# Patient Record
Sex: Female | Born: 1940 | Race: White | Hispanic: No | Marital: Married | Smoking: Never smoker
Health system: Southern US, Community
[De-identification: ages and names within clinical notes are randomized; demographics above are authoritative.]

## PROBLEM LIST (undated history)

## (undated) DIAGNOSIS — M1990 Unspecified osteoarthritis, unspecified site: Secondary | ICD-10-CM

## (undated) DIAGNOSIS — Z87898 Personal history of other specified conditions: Secondary | ICD-10-CM

## (undated) DIAGNOSIS — F419 Anxiety disorder, unspecified: Secondary | ICD-10-CM

## (undated) DIAGNOSIS — H9190 Unspecified hearing loss, unspecified ear: Secondary | ICD-10-CM

## (undated) DIAGNOSIS — K5730 Diverticulosis of large intestine without perforation or abscess without bleeding: Secondary | ICD-10-CM

## (undated) HISTORY — DX: Anxiety disorder, unspecified: F41.9

## (undated) HISTORY — DX: Personal history of other specified conditions: Z87.898

## (undated) HISTORY — PX: CARPAL TUNNEL RELEASE: SHX101

## (undated) HISTORY — PX: TONSILLECTOMY: SUR1361

## (undated) HISTORY — DX: Unspecified hearing loss, unspecified ear: H91.90

---

## 1997-08-20 ENCOUNTER — Other Ambulatory Visit: Admission: RE | Admit: 1997-08-20 | Discharge: 1997-08-20 | Payer: Self-pay | Admitting: Obstetrics and Gynecology

## 1997-09-11 ENCOUNTER — Ambulatory Visit (HOSPITAL_COMMUNITY): Admission: RE | Admit: 1997-09-11 | Discharge: 1997-09-11 | Payer: Self-pay | Admitting: Sports Medicine

## 1997-09-17 ENCOUNTER — Other Ambulatory Visit: Admission: RE | Admit: 1997-09-17 | Discharge: 1997-09-17 | Payer: Self-pay | Admitting: Obstetrics and Gynecology

## 1998-07-01 ENCOUNTER — Inpatient Hospital Stay (HOSPITAL_COMMUNITY): Admission: RE | Admit: 1998-07-01 | Discharge: 1998-07-06 | Payer: Self-pay

## 1998-11-18 ENCOUNTER — Other Ambulatory Visit: Admission: RE | Admit: 1998-11-18 | Discharge: 1998-11-18 | Payer: Self-pay | Admitting: Obstetrics and Gynecology

## 1999-06-25 ENCOUNTER — Encounter: Payer: Self-pay | Admitting: Obstetrics and Gynecology

## 1999-06-25 ENCOUNTER — Encounter: Admission: RE | Admit: 1999-06-25 | Discharge: 1999-06-25 | Payer: Self-pay | Admitting: Obstetrics and Gynecology

## 1999-07-07 ENCOUNTER — Encounter: Payer: Self-pay | Admitting: Obstetrics and Gynecology

## 1999-07-07 ENCOUNTER — Encounter: Admission: RE | Admit: 1999-07-07 | Discharge: 1999-07-07 | Payer: Self-pay | Admitting: Obstetrics and Gynecology

## 1999-07-08 ENCOUNTER — Other Ambulatory Visit: Admission: RE | Admit: 1999-07-08 | Discharge: 1999-07-08 | Payer: Self-pay | Admitting: Obstetrics and Gynecology

## 1999-12-01 ENCOUNTER — Other Ambulatory Visit: Admission: RE | Admit: 1999-12-01 | Discharge: 1999-12-01 | Payer: Self-pay | Admitting: Obstetrics and Gynecology

## 2000-07-07 ENCOUNTER — Encounter: Admission: RE | Admit: 2000-07-07 | Discharge: 2000-07-07 | Payer: Self-pay | Admitting: Obstetrics and Gynecology

## 2000-07-07 ENCOUNTER — Encounter: Payer: Self-pay | Admitting: Obstetrics and Gynecology

## 2000-08-09 ENCOUNTER — Other Ambulatory Visit: Admission: RE | Admit: 2000-08-09 | Discharge: 2000-08-09 | Payer: Self-pay | Admitting: Obstetrics and Gynecology

## 2001-07-26 ENCOUNTER — Encounter: Payer: Self-pay | Admitting: Obstetrics and Gynecology

## 2001-07-26 ENCOUNTER — Encounter: Admission: RE | Admit: 2001-07-26 | Discharge: 2001-07-26 | Payer: Self-pay | Admitting: Obstetrics and Gynecology

## 2001-08-02 ENCOUNTER — Encounter: Admission: RE | Admit: 2001-08-02 | Discharge: 2001-08-02 | Payer: Self-pay | Admitting: Obstetrics and Gynecology

## 2001-08-02 ENCOUNTER — Encounter: Payer: Self-pay | Admitting: Obstetrics and Gynecology

## 2001-08-21 ENCOUNTER — Other Ambulatory Visit: Admission: RE | Admit: 2001-08-21 | Discharge: 2001-08-21 | Payer: Self-pay | Admitting: Obstetrics and Gynecology

## 2002-06-07 HISTORY — PX: JOINT REPLACEMENT: SHX530

## 2002-10-29 ENCOUNTER — Encounter: Admission: RE | Admit: 2002-10-29 | Discharge: 2002-10-29 | Payer: Self-pay | Admitting: Obstetrics and Gynecology

## 2002-10-29 ENCOUNTER — Encounter: Payer: Self-pay | Admitting: Obstetrics and Gynecology

## 2002-10-30 ENCOUNTER — Other Ambulatory Visit: Admission: RE | Admit: 2002-10-30 | Discharge: 2002-10-30 | Payer: Self-pay | Admitting: Obstetrics and Gynecology

## 2003-12-26 ENCOUNTER — Encounter: Admission: RE | Admit: 2003-12-26 | Discharge: 2003-12-26 | Payer: Self-pay | Admitting: Obstetrics and Gynecology

## 2004-06-07 HISTORY — PX: COLON SURGERY: SHX602

## 2005-01-07 ENCOUNTER — Encounter: Admission: RE | Admit: 2005-01-07 | Discharge: 2005-01-07 | Payer: Self-pay | Admitting: Obstetrics and Gynecology

## 2006-02-01 ENCOUNTER — Encounter: Admission: RE | Admit: 2006-02-01 | Discharge: 2006-02-01 | Payer: Self-pay | Admitting: Obstetrics and Gynecology

## 2007-03-02 ENCOUNTER — Encounter: Admission: RE | Admit: 2007-03-02 | Discharge: 2007-03-02 | Payer: Self-pay | Admitting: Obstetrics and Gynecology

## 2008-03-05 ENCOUNTER — Encounter: Admission: RE | Admit: 2008-03-05 | Discharge: 2008-03-05 | Payer: Self-pay | Admitting: Obstetrics and Gynecology

## 2009-03-11 ENCOUNTER — Encounter: Admission: RE | Admit: 2009-03-11 | Discharge: 2009-03-11 | Payer: Self-pay | Admitting: Obstetrics and Gynecology

## 2010-04-07 ENCOUNTER — Encounter: Admission: RE | Admit: 2010-04-07 | Discharge: 2010-04-07 | Payer: Self-pay | Admitting: Obstetrics and Gynecology

## 2011-03-12 ENCOUNTER — Other Ambulatory Visit: Payer: Self-pay | Admitting: Obstetrics & Gynecology

## 2011-03-12 DIAGNOSIS — Z1231 Encounter for screening mammogram for malignant neoplasm of breast: Secondary | ICD-10-CM

## 2011-04-13 ENCOUNTER — Ambulatory Visit: Payer: Self-pay

## 2011-04-15 ENCOUNTER — Ambulatory Visit
Admission: RE | Admit: 2011-04-15 | Discharge: 2011-04-15 | Disposition: A | Discharge status: Home / Self Care | Payer: Medicare Other | Source: Ambulatory Visit | Attending: Obstetrics & Gynecology | Admitting: Obstetrics & Gynecology

## 2011-04-15 DIAGNOSIS — Z1231 Encounter for screening mammogram for malignant neoplasm of breast: Secondary | ICD-10-CM

## 2012-02-04 ENCOUNTER — Other Ambulatory Visit: Payer: Self-pay | Admitting: Orthopedic Surgery

## 2012-02-17 ENCOUNTER — Encounter (HOSPITAL_COMMUNITY): Payer: Self-pay

## 2012-02-17 NOTE — Pre-Procedure Instructions (Signed)
20 Christy Preston  02/17/2012   Your procedure is scheduled on:  Monday Septemeber 23, 2013.  Report to [facility] at 0530 AM.  Call this number if you have problems the morning of surgery: [phone]   Remember:   Do not eat food or drink:After Midnight.    Take these medicines the morning of surgery with A SIP OF WATER: NONE   Do not wear jewelry, make-up or nail polish.  Do not wear lotions, powders, or perfumes.   Do not shave 48 hours prior to surgery.   Do not bring valuables to the hospital.  Contacts, dentures or bridgework may not be worn into surgery.  Leave suitcase in the car. After surgery it may be brought to your room.  For patients admitted to the hospital, checkout time is 11:00 AM the day of discharge.   Patients discharged the day of surgery will not be allowed to drive home.  Name and phone number of your driver:   Special Instructions: CHG Shower Use Special Wash: 1/2 bottle night before surgery and 1/2 bottle morning of surgery.   Please read over the following fact sheets that you were given: Pain Booklet, Coughing and Deep Breathing, Blood Transfusion Information, Total Joint Packet, MRSA Information and Surgical Site Infection Prevention

## 2012-02-18 ENCOUNTER — Encounter (HOSPITAL_COMMUNITY): Payer: Self-pay

## 2012-02-18 ENCOUNTER — Ambulatory Visit (HOSPITAL_COMMUNITY)
Admission: RE | Admit: 2012-02-18 | Discharge: 2012-02-18 | Disposition: A | Discharge status: Home / Self Care | Payer: Medicare Other | Source: Ambulatory Visit | Attending: Orthopedic Surgery | Admitting: Orthopedic Surgery

## 2012-02-18 ENCOUNTER — Other Ambulatory Visit (HOSPITAL_COMMUNITY): Payer: Medicare Other

## 2012-02-18 ENCOUNTER — Encounter (HOSPITAL_COMMUNITY)
Admission: RE | Admit: 2012-02-18 | Discharge: 2012-02-18 | Disposition: A | Discharge status: Home / Self Care | Payer: Medicare Other | Source: Ambulatory Visit | Attending: Orthopedic Surgery | Admitting: Orthopedic Surgery

## 2012-02-18 DIAGNOSIS — Z01818 Encounter for other preprocedural examination: Secondary | ICD-10-CM | POA: Insufficient documentation

## 2012-02-18 DIAGNOSIS — Z01810 Encounter for preprocedural cardiovascular examination: Secondary | ICD-10-CM | POA: Insufficient documentation

## 2012-02-18 DIAGNOSIS — Z01812 Encounter for preprocedural laboratory examination: Secondary | ICD-10-CM | POA: Insufficient documentation

## 2012-02-18 HISTORY — DX: Unspecified osteoarthritis, unspecified site: M19.90

## 2012-02-18 HISTORY — DX: Diverticulosis of large intestine without perforation or abscess without bleeding: K57.30

## 2012-02-18 LAB — BASIC METABOLIC PANEL
CO2: 30 mEq/L (ref 19–32)
Calcium: 9.9 mg/dL (ref 8.4–10.5)
GFR calc non Af Amer: 88 mL/min — ABNORMAL LOW (ref >90)
Potassium: 3.8 mEq/L (ref 3.5–5.1)
Sodium: 139 mEq/L (ref 135–145)

## 2012-02-18 LAB — CBC WITH DIFFERENTIAL/PLATELET
Hemoglobin: 14.7 g/dL (ref 12.0–15.0)
Lymphocytes Relative: 16 % (ref 12–46)
Lymphs Abs: 1 10*3/uL (ref 0.7–4.0)
Monocytes Relative: 10 % (ref 3–12)
Neutrophils Relative %: 71 % (ref 43–77)
Platelets: 264 10*3/uL (ref 150–400)
RBC: 4.78 MIL/uL (ref 3.87–5.11)
WBC: 6.3 10*3/uL (ref 4.0–10.5)

## 2012-02-18 LAB — URINALYSIS, ROUTINE W REFLEX MICROSCOPIC
Bilirubin Urine: NEGATIVE
Ketones, ur: NEGATIVE mg/dL
Nitrite: NEGATIVE
Protein, ur: NEGATIVE mg/dL
Urobilinogen, UA: 0.2 mg/dL (ref 0.0–1.0)

## 2012-02-18 LAB — PROTIME-INR
INR: 0.92 (ref 0.00–1.49)
Prothrombin Time: 12.6 seconds (ref 11.6–15.2)

## 2012-02-18 LAB — SURGICAL PCR SCREEN
MRSA, PCR: POSITIVE — AB
Staphylococcus aureus: POSITIVE — AB

## 2012-02-18 LAB — TYPE AND SCREEN
ABO/RH(D): O POS
Antibody Screen: NEGATIVE

## 2012-02-18 LAB — APTT: aPTT: 32 seconds (ref 24–37)

## 2012-02-18 NOTE — Progress Notes (Signed)
Patient informed Nurse that she had a stress test a few years ago but was unsure as to where she had it at. PCP is Dr. Selena Batten at [facility]. Will request LOV and see if stress test results are at [facility].

## 2012-02-21 ENCOUNTER — Other Ambulatory Visit (HOSPITAL_COMMUNITY): Payer: Medicare Other

## 2012-02-22 NOTE — Consult Note (Addendum)
Anesthesia Chart Review:  Patient is a 71 year old female posted for a left TKA on 02/28/12 by Dr. Turner Daniels.  History includes non-smoker, arthritis, diverticulosis, colon resection '06, right TKA '04.  BMI is 25.27.  PCP is Dr. Gweneth Dimitri [facility]).  Labs noted.  CXR on 02/18/12 showed findings suggesting underlying chronic bronchitic change. No worrisome focal or acute abnormality identified.  EKG 02/18/12 showed NSR with sinus arrythmia, LAD, poor r wave progression in precordial leads, (new in V4-6) since 11/30/04.  She has had a stress test on 12/29/04 [facility]) that showed a fixed perfusion defect in the basal septum consistent with breast attenuation, no inducible ischemia, normal LV systolic function, no regional wall motion abnormalities, EF 91%.  No CV symptoms were documented at her PAT visit.  She has no documented history of HTN, DM, CAD/MI, CHF, or smoking.  Clinical correlation on the day of surgery.  I remains asymptomatic then anticipate she can proceed as planned. Anesthesiologist Dr. Chaney Malling agrees with plan.  Shonna Chock, PA-C

## 2012-02-25 NOTE — H&P (Signed)
  HPI: Patient presents with a chief complaint of left knee pain secondary to end-stage arthritis.  She has had this for many years.  She had a right total knee done down in [place] using a [employer] system in 2004 that is done well except for discomfort in the peripatellar region with some occasional numbness.  The total knee.  He has not had any swelling fever or erythema and continues to perform well.  Her left knee is down to bone-on-bone arthritis and she is considering knee replacement in September of 2013 here and [place].  She is wondering if a cortisone injection may help her left knee today.  All: None  ROS: 14 point review of systems form filled out by the patient was reviewed and was negative as it relates to the history of present illness except for:  She is not a diabetic.  She does not have elevated cholesterol she is worn glasses since 1956  PMH:  She had diverticulitis surgery in 2000, knee replacement 2004 on the right carpal tunnel release 2008.  FHx: Diabetes, high blood pressure heart disease and arthritis  SocHx:  She does not smoke.  She has 1 glass of wine every other day, she is married, lives with one other person and is retired.  PHYSICAL EXAM: Well-developed, well-nourished.  Awake, alert, and oriented x3.  Extraocular motion is intact.  No use of accessory respiratory muscles for breathing.   Cardiovascular exam reveals a regular rhythm.  Skin is intact without cuts, scrapes, or abrasions. The right total knee range is from 0-130, collateral ligaments are stable, there is no effusion.  Her wound is well-healed and there is no sign of infection.  The left knee has an obvious varus deformity lacks 5 of full extension, flexed about 115, 1+ effusion and again neurovascular intact.  Imaging/Tests: AP, Rosenberg, lateral, and sunrise x-rays of both knees ordered and interpreted by me show end-stage arthritis.  The left knee with varus deformity and a well-placed well  fixed cemented Katrinka Blazing nephew total knee on the right.  There is no sign of loosening of the components.  Assess: #1 Well-placed well fixed and well functioning right Smith Nephew total knee with a little bit of tenderness along the anterior aspect of the wound in the peripatellar region the subjective numbness.  #2 End-stage arthritis left knee  Plan:  The right knee will need annual checkups to make sure that the components are in good position with no evidence of loosening or ostial lysis.  She may call back in August to schedule her left knee replacement in September.  If she so desires.  It will be a Mudlogger cemented.

## 2012-02-27 MED ORDER — CEFAZOLIN SODIUM-DEXTROSE 2-3 GM-% IV SOLR
2.0000 g | INTRAVENOUS | Status: AC
  Administered 2012-02-28: 2 g via INTRAVENOUS
  Filled 2012-02-27: qty 50

## 2012-02-28 ENCOUNTER — Encounter (HOSPITAL_COMMUNITY): Payer: Self-pay | Admitting: *Deleted

## 2012-02-28 ENCOUNTER — Encounter (HOSPITAL_COMMUNITY)
Admission: RE | Disposition: A | Discharge status: Home Health | Payer: Self-pay | Source: Ambulatory Visit | Attending: Orthopedic Surgery

## 2012-02-28 ENCOUNTER — Encounter (HOSPITAL_COMMUNITY): Payer: Self-pay | Admitting: Vascular Surgery

## 2012-02-28 ENCOUNTER — Inpatient Hospital Stay (HOSPITAL_COMMUNITY): Payer: Medicare Other | Admitting: Vascular Surgery

## 2012-02-28 ENCOUNTER — Inpatient Hospital Stay (HOSPITAL_COMMUNITY)
Admission: RE | Admit: 2012-02-28 | Discharge: 2012-03-01 | DRG: 470 | Disposition: A | Discharge status: Home Health | Payer: Medicare Other | Source: Ambulatory Visit | Attending: Orthopedic Surgery | Admitting: Orthopedic Surgery

## 2012-02-28 DIAGNOSIS — Z7982 Long term (current) use of aspirin: Secondary | ICD-10-CM

## 2012-02-28 DIAGNOSIS — Z8261 Family history of arthritis: Secondary | ICD-10-CM

## 2012-02-28 DIAGNOSIS — Z8249 Family history of ischemic heart disease and other diseases of the circulatory system: Secondary | ICD-10-CM

## 2012-02-28 DIAGNOSIS — M1712 Unilateral primary osteoarthritis, left knee: Secondary | ICD-10-CM | POA: Diagnosis present

## 2012-02-28 DIAGNOSIS — Z79899 Other long term (current) drug therapy: Secondary | ICD-10-CM

## 2012-02-28 DIAGNOSIS — K5730 Diverticulosis of large intestine without perforation or abscess without bleeding: Secondary | ICD-10-CM | POA: Diagnosis present

## 2012-02-28 DIAGNOSIS — M1710 Unilateral primary osteoarthritis, unspecified knee: Principal | ICD-10-CM | POA: Diagnosis present

## 2012-02-28 DIAGNOSIS — Z96659 Presence of unspecified artificial knee joint: Secondary | ICD-10-CM

## 2012-02-28 DIAGNOSIS — Z833 Family history of diabetes mellitus: Secondary | ICD-10-CM

## 2012-02-28 HISTORY — PX: TOTAL KNEE ARTHROPLASTY: SHX125

## 2012-02-28 SURGERY — ARTHROPLASTY, KNEE, TOTAL
Anesthesia: General | Site: Knee | Laterality: Left | Wound class: Clean

## 2012-02-28 MED ORDER — DEXTROSE-NACL 5-0.45 % IV SOLN: INTRAVENOUS | Status: DC

## 2012-02-28 MED ORDER — HYDROMORPHONE HCL PF 1 MG/ML IJ SOLN
0.2500 mg | INTRAMUSCULAR | Status: DC | PRN
  Administered 2012-02-28 (×2): 0.5 mg via INTRAVENOUS

## 2012-02-28 MED ORDER — CEFUROXIME SODIUM 1.5 G IJ SOLR
INTRAMUSCULAR | Status: DC | PRN
  Administered 2012-02-28: 1.5 g

## 2012-02-28 MED ORDER — ACETAMINOPHEN 10 MG/ML IV SOLN
1000.0000 mg | Freq: Four times a day (QID) | INTRAVENOUS | Status: AC
  Administered 2012-02-28 – 2012-02-29 (×4): 1000 mg via INTRAVENOUS
  Filled 2012-02-28 (×4): qty 100

## 2012-02-28 MED ORDER — CHLORHEXIDINE GLUCONATE 4 % EX LIQD: 60.0000 mL | Freq: Once | CUTANEOUS | Status: DC

## 2012-02-28 MED ORDER — METOCLOPRAMIDE HCL 5 MG/ML IJ SOLN: 5.0000 mg | Freq: Three times a day (TID) | INTRAMUSCULAR | Status: DC | PRN

## 2012-02-28 MED ORDER — MAGNESIUM HYDROXIDE 400 MG/5ML PO SUSP: 30.0000 mL | Freq: Every day | ORAL | Status: DC | PRN

## 2012-02-28 MED ORDER — LACTATED RINGERS IV SOLN
INTRAVENOUS | Status: DC | PRN
  Administered 2012-02-28 (×2): via INTRAVENOUS

## 2012-02-28 MED ORDER — MIDAZOLAM HCL 5 MG/5ML IJ SOLN
INTRAMUSCULAR | Status: DC | PRN
  Administered 2012-02-28 (×2): 1 mg via INTRAVENOUS

## 2012-02-28 MED ORDER — ACETAMINOPHEN 325 MG PO TABS: 650.0000 mg | ORAL_TABLET | Freq: Four times a day (QID) | ORAL | Status: DC | PRN

## 2012-02-28 MED ORDER — OXYCODONE HCL 5 MG PO TABS
5.0000 mg | ORAL_TABLET | ORAL | Status: DC | PRN
  Administered 2012-02-28 – 2012-02-29 (×2): 5 mg via ORAL
  Administered 2012-02-29: 10 mg via ORAL
  Administered 2012-02-29 (×2): 5 mg via ORAL
  Administered 2012-03-01 (×2): 10 mg via ORAL
  Filled 2012-02-28 (×2): qty 2
  Filled 2012-02-28: qty 1
  Filled 2012-02-28: qty 2
  Filled 2012-02-28 (×3): qty 1

## 2012-02-28 MED ORDER — BISACODYL 5 MG PO TBEC: 5.0000 mg | DELAYED_RELEASE_TABLET | Freq: Every day | ORAL | Status: DC | PRN

## 2012-02-28 MED ORDER — KCL IN DEXTROSE-NACL 20-5-0.45 MEQ/L-%-% IV SOLN
INTRAVENOUS | Status: DC
  Administered 2012-02-28: 10:00:00 via INTRAVENOUS
  Filled 2012-02-28 (×9): qty 1000

## 2012-02-28 MED ORDER — PHENOL 1.4 % MT LIQD: 1.0000 | OROMUCOSAL | Status: DC | PRN

## 2012-02-28 MED ORDER — FENTANYL CITRATE 0.05 MG/ML IJ SOLN
INTRAMUSCULAR | Status: DC | PRN
  Administered 2012-02-28 (×5): 50 ug via INTRAVENOUS

## 2012-02-28 MED ORDER — OXYCODONE HCL 5 MG PO TABS: 5.0000 mg | ORAL_TABLET | Freq: Once | ORAL | Status: DC | PRN

## 2012-02-28 MED ORDER — ZOLPIDEM TARTRATE 5 MG PO TABS: 5.0000 mg | ORAL_TABLET | Freq: Every evening | ORAL | Status: DC | PRN

## 2012-02-28 MED ORDER — BUPIVACAINE HCL (PF) 0.5 % IJ SOLN
INTRAMUSCULAR | Status: DC | PRN
  Administered 2012-02-28: 30 mL

## 2012-02-28 MED ORDER — HYDROMORPHONE HCL PF 1 MG/ML IJ SOLN
INTRAMUSCULAR | Status: AC
  Filled 2012-02-28: qty 1

## 2012-02-28 MED ORDER — OXYCODONE HCL 5 MG/5ML PO SOLN: 5.0000 mg | Freq: Once | ORAL | Status: DC | PRN

## 2012-02-28 MED ORDER — HYDROMORPHONE HCL PF 1 MG/ML IJ SOLN: 1.0000 mg | INTRAMUSCULAR | Status: DC | PRN

## 2012-02-28 MED ORDER — DIPHENHYDRAMINE HCL 12.5 MG/5ML PO ELIX: 12.5000 mg | ORAL_SOLUTION | ORAL | Status: DC | PRN

## 2012-02-28 MED ORDER — SODIUM CHLORIDE 0.9 % IR SOLN
Status: DC | PRN
  Administered 2012-02-28: 3000 mL

## 2012-02-28 MED ORDER — KCL IN DEXTROSE-NACL 20-5-0.45 MEQ/L-%-% IV SOLN
INTRAVENOUS | Status: AC
  Filled 2012-02-28: qty 1000

## 2012-02-28 MED ORDER — METHOCARBAMOL 500 MG PO TABS
500.0000 mg | ORAL_TABLET | Freq: Four times a day (QID) | ORAL | Status: DC | PRN
  Administered 2012-02-28: 500 mg via ORAL
  Filled 2012-02-28 (×2): qty 1

## 2012-02-28 MED ORDER — EPHEDRINE SULFATE 50 MG/ML IJ SOLN
INTRAMUSCULAR | Status: DC | PRN
  Administered 2012-02-28 (×3): 5 mg via INTRAVENOUS

## 2012-02-28 MED ORDER — LIDOCAINE HCL (CARDIAC) 20 MG/ML IV SOLN
INTRAVENOUS | Status: DC | PRN
  Administered 2012-02-28: 50 mg via INTRAVENOUS

## 2012-02-28 MED ORDER — ONDANSETRON HCL 4 MG/2ML IJ SOLN: 4.0000 mg | Freq: Four times a day (QID) | INTRAMUSCULAR | Status: DC | PRN

## 2012-02-28 MED ORDER — ONDANSETRON HCL 4 MG PO TABS: 4.0000 mg | ORAL_TABLET | Freq: Four times a day (QID) | ORAL | Status: DC | PRN

## 2012-02-28 MED ORDER — FLEET ENEMA 7-19 GM/118ML RE ENEM: 1.0000 | ENEMA | Freq: Once | RECTAL | Status: AC | PRN

## 2012-02-28 MED ORDER — METOCLOPRAMIDE HCL 10 MG PO TABS: 5.0000 mg | ORAL_TABLET | Freq: Three times a day (TID) | ORAL | Status: DC | PRN

## 2012-02-28 MED ORDER — MENTHOL 3 MG MT LOZG: 1.0000 | LOZENGE | OROMUCOSAL | Status: DC | PRN

## 2012-02-28 MED ORDER — METHOCARBAMOL 100 MG/ML IJ SOLN
500.0000 mg | Freq: Four times a day (QID) | INTRAVENOUS | Status: DC | PRN
  Administered 2012-02-28: 500 mg via INTRAVENOUS
  Filled 2012-02-28: qty 5

## 2012-02-28 MED ORDER — ACETAMINOPHEN 650 MG RE SUPP: 650.0000 mg | Freq: Four times a day (QID) | RECTAL | Status: DC | PRN

## 2012-02-28 MED ORDER — ASPIRIN EC 325 MG PO TBEC
325.0000 mg | DELAYED_RELEASE_TABLET | Freq: Two times a day (BID) | ORAL | Status: DC
  Administered 2012-02-28 – 2012-03-01 (×5): 325 mg via ORAL
  Filled 2012-02-28 (×6): qty 1

## 2012-02-28 MED ORDER — PROPOFOL 10 MG/ML IV EMUL
INTRAVENOUS | Status: DC | PRN
  Administered 2012-02-28: 150 mg via INTRAVENOUS

## 2012-02-28 MED ORDER — ALUM & MAG HYDROXIDE-SIMETH 200-200-20 MG/5ML PO SUSP: 30.0000 mL | ORAL | Status: DC | PRN

## 2012-02-28 MED ORDER — ONDANSETRON HCL 4 MG/2ML IJ SOLN: 4.0000 mg | Freq: Once | INTRAMUSCULAR | Status: DC | PRN

## 2012-02-28 MED ORDER — CEFUROXIME SODIUM 1.5 G IJ SOLR
INTRAMUSCULAR | Status: AC
  Filled 2012-02-28: qty 1.5

## 2012-02-28 MED ORDER — ONDANSETRON HCL 4 MG/2ML IJ SOLN
INTRAMUSCULAR | Status: DC | PRN
  Administered 2012-02-28: 4 mg via INTRAVENOUS

## 2012-02-28 MED ORDER — CELECOXIB 200 MG PO CAPS
200.0000 mg | ORAL_CAPSULE | Freq: Two times a day (BID) | ORAL | Status: DC
  Administered 2012-02-28 – 2012-03-01 (×5): 200 mg via ORAL
  Filled 2012-02-28 (×6): qty 1

## 2012-02-28 MED ORDER — PHENYLEPHRINE HCL 10 MG/ML IJ SOLN
INTRAMUSCULAR | Status: DC | PRN
  Administered 2012-02-28: 80 mg via INTRAVENOUS

## 2012-02-28 SURGICAL SUPPLY — 55 items
BANDAGE ELASTIC 6 VELCRO ST LF (GAUZE/BANDAGES/DRESSINGS) ×1 IMPLANT
BANDAGE ESMARK 6X9 LF (GAUZE/BANDAGES/DRESSINGS) ×1 IMPLANT
BLADE SAG 18X100X1.27 (BLADE) ×2 IMPLANT
BLADE SAW SGTL 13X75X1.27 (BLADE) ×2 IMPLANT
BLADE SURG ROTATE 9660 (MISCELLANEOUS) IMPLANT
BNDG CMPR 9X6 STRL LF SNTH (GAUZE/BANDAGES/DRESSINGS) ×1
BNDG CMPR MED 10X6 ELC LF (GAUZE/BANDAGES/DRESSINGS) ×1
BNDG ELASTIC 6X10 VLCR STRL LF (GAUZE/BANDAGES/DRESSINGS) ×2 IMPLANT
BNDG ESMARK 6X9 LF (GAUZE/BANDAGES/DRESSINGS) ×2
BOWL SMART MIX CTS (DISPOSABLE) ×2 IMPLANT
CEMENT HV SMART SET (Cement) ×4 IMPLANT
CLOTH BEACON ORANGE TIMEOUT ST (SAFETY) ×2 IMPLANT
COVER BACK TABLE 24X17X13 BIG (DRAPES) IMPLANT
COVER SURGICAL LIGHT HANDLE (MISCELLANEOUS) ×2 IMPLANT
CUFF TOURNIQUET SINGLE 34IN LL (TOURNIQUET CUFF) IMPLANT
CUFF TOURNIQUET SINGLE 44IN (TOURNIQUET CUFF) IMPLANT
DRAPE EXTREMITY T 121X128X90 (DRAPE) ×2 IMPLANT
DRAPE U-SHAPE 47X51 STRL (DRAPES) ×2 IMPLANT
DRSG PAD ABDOMINAL 8X10 ST (GAUZE/BANDAGES/DRESSINGS) ×1 IMPLANT
DURAPREP 26ML APPLICATOR (WOUND CARE) ×2 IMPLANT
ELECT REM PT RETURN 9FT ADLT (ELECTROSURGICAL) ×2
ELECTRODE REM PT RTRN 9FT ADLT (ELECTROSURGICAL) ×1 IMPLANT
EVACUATOR 1/8 PVC DRAIN (DRAIN) ×2 IMPLANT
GAUZE XEROFORM 1X8 LF (GAUZE/BANDAGES/DRESSINGS) ×2 IMPLANT
GLOVE BIO SURGEON STRL SZ7 (GLOVE) ×2 IMPLANT
GLOVE BIO SURGEON STRL SZ7.5 (GLOVE) ×2 IMPLANT
GLOVE BIOGEL PI IND STRL 7.0 (GLOVE) ×1 IMPLANT
GLOVE BIOGEL PI IND STRL 8 (GLOVE) ×1 IMPLANT
GLOVE BIOGEL PI INDICATOR 7.0 (GLOVE) ×1
GLOVE BIOGEL PI INDICATOR 8 (GLOVE) ×1
GOWN PREVENTION PLUS XLARGE (GOWN DISPOSABLE) ×2 IMPLANT
GOWN STRL NON-REIN LRG LVL3 (GOWN DISPOSABLE) ×4 IMPLANT
HANDPIECE INTERPULSE COAX TIP (DISPOSABLE) ×2
HOOD PEEL AWAY FACE SHEILD DIS (HOOD) ×6 IMPLANT
KIT BASIN OR (CUSTOM PROCEDURE TRAY) ×2 IMPLANT
KIT ROOM TURNOVER OR (KITS) ×2 IMPLANT
MANIFOLD NEPTUNE II (INSTRUMENTS) ×2 IMPLANT
NS IRRIG 1000ML POUR BTL (IV SOLUTION) ×2 IMPLANT
PACK TOTAL JOINT (CUSTOM PROCEDURE TRAY) ×2 IMPLANT
PAD ARMBOARD 7.5X6 YLW CONV (MISCELLANEOUS) ×4 IMPLANT
PADDING CAST COTTON 6X4 STRL (CAST SUPPLIES) ×2 IMPLANT
SET HNDPC FAN SPRY TIP SCT (DISPOSABLE) ×1 IMPLANT
SPONGE GAUZE 4X4 12PLY (GAUZE/BANDAGES/DRESSINGS) ×3 IMPLANT
STAPLER VISISTAT 35W (STAPLE) ×2 IMPLANT
SUCTION FRAZIER TIP 10 FR DISP (SUCTIONS) ×2 IMPLANT
SUT VIC AB 0 CTX 36 (SUTURE) ×2
SUT VIC AB 0 CTX36XBRD ANTBCTR (SUTURE) ×1 IMPLANT
SUT VIC AB 1 CTX 36 (SUTURE) ×2
SUT VIC AB 1 CTX36XBRD ANBCTR (SUTURE) ×1 IMPLANT
SUT VIC AB 2-0 CT1 27 (SUTURE) ×2
SUT VIC AB 2-0 CT1 TAPERPNT 27 (SUTURE) ×1 IMPLANT
TOWEL OR 17X24 6PK STRL BLUE (TOWEL DISPOSABLE) ×2 IMPLANT
TOWEL OR 17X26 10 PK STRL BLUE (TOWEL DISPOSABLE) ×2 IMPLANT
TRAY FOLEY CATH 14FR (SET/KITS/TRAYS/PACK) IMPLANT
WATER STERILE IRR 1000ML POUR (IV SOLUTION) ×6 IMPLANT

## 2012-02-28 NOTE — Op Note (Signed)
PATIENT ID:      Christy Preston  MRN:     [id number] DOB/AGE:    [birth year] / 71 y.o.       OPERATIVE REPORT    DATE OF PROCEDURE:  02/28/2012       PREOPERATIVE DIAGNOSIS:   OSTEOARTHRITIS LEFT KNEE      There is no height or weight on file to calculate BMI.                                                        POSTOPERATIVE DIAGNOSIS:   OSTEOARTHRITIS LEFT KNEE                                                                      PROCEDURE:  Procedure(s): TOTAL KNEE ARTHROPLASTY Using Depuy Sigma RP implants #2.5L Femur, #2.5Tibia, 10mm sigma RP bearing, 35 Patella     SURGEON: Camren Henthorn J    ASSISTANT:   Shirl Harris PA-C   (Present and scrubbed throughout the case, critical for assistance with exposure, retraction, instrumentation, and closure.)         ANESTHESIA: GET with Femoral Nerve Block  DRAINS: foley, 2 medium hemovac in knee   TOURNIQUET TIME:   COMPLICATIONS:  None     SPECIMENS: None   INDICATIONS FOR PROCEDURE: The patient has  OSTEOARTHRITIS LEFT KNEE, varus deformities, XR shows bone on bone arthritis. Patient has failed all conservative measures including anti-inflammatory medicines, narcotics, attempts at  exercise and weight loss, cortisone injections and viscosupplementation.  Risks and benefits of surgery have been discussed, questions answered.   DESCRIPTION OF PROCEDURE: The patient identified by armband, received  right femoral nerve block and IV antibiotics, in the holding area at [facility]. Patient taken to the operating room, appropriate anesthetic  monitors were attached General endotracheal anesthesia induced with  the patient in supine position, Foley catheter was inserted. Tourniquet  applied high to the operative thigh. Lateral post and foot positioner  applied to the table, the lower extremity was then prepped and draped  in usual sterile fashion from the ankle to the tourniquet. Time-out procedure was performed. The limb was  wrapped with an Esmarch bandage and the tourniquet inflated to 350 mmHg. We began the operation by making the anterior midline incision starting at handbreadth above the patella going over the patella 1 cm medial to and  4 cm distal to the tibial tubercle. Small bleeders in the skin and the  subcutaneous tissue identified and cauterized. Transverse retinaculum was incised and reflected medially and a medial parapatellar arthrotomy was accomplished. the patella was everted and theprepatellar fat pad resected. The superficial medial collateral  ligament was then elevated from anterior to posterior along the proximal  flare of the tibia and anterior half of the menisci resected. The knee was hyperflexed exposing bone on bone arthritis. Peripheral and notch osteophytes as well as the cruciate ligaments were then resected. We continued to  work our way around posteriorly along the proximal tibia, and externally  rotated the tibia subluxing it out from underneath the femur.  A McHale  retractor was placed through the notch and a lateral Hohmann retractor  placed, and we then drilled through the proximal tibia in line with the  axis of the tibia followed by an intramedullary guide rod and 2-degree  posterior slope cutting guide. The tibial cutting guide was pinned into place  allowing resection of 4 mm of bone medially and about 8 mm of bone  laterally because of her varus deformity. Satisfied with the tibial resection, we then  entered the distal femur 2 mm anterior to the PCL origin with the  intramedullary guide rod and applied the distal femoral cutting guide  set at 11mm, with 5 degrees of valgus. This was pinned along the  epicondylar axis. At this point, the distal femoral cut was accomplished without difficulty. We then sized for a #2.5L femoral component and pinned the guide in 3 degrees of external rotation.The chamfer cutting guide was pinned into place. The anterior, posterior, and chamfer cuts  were accomplished without difficulty followed by  the Sigma RP box cutting guide and the box cut. We also removed posterior osteophytes from the posterior femoral condyles. At this  time, the knee was brought into full extension. We checked our  extension and flexion gaps and found them symmetric at 10mm.  The patella thickness measured at 22 mm. We set the cutting guide at 13 and removed the posterior 9.5-10 mm  of the patella sized for 35 button and drilled the lollipop. The knee  was then once again hyperflexed exposing the proximal tibia. We sized for a #2.5 tibial base plate, applied the smokestack and the conical reamer followed by the the Delta fin keel punch. We then hammered into place the Sigma RP trial femoral component, inserted a 10-mm trial bearing, trial patellar button, and took the knee through range of motion from 0-130 degrees. No thumb pressure was required for patellar  tracking. At this point, all trial components were removed, a double batch of DePuy HV cement with 1500 mg of Zinacef was mixed and applied to all bony metallic mating surfaces except for the posterior condyles of the femur itself. In order, we  hammered into place the tibial tray and removed excess cement, the femoral component and removed excess cement, a 10-mm Sigma RP bearing  was inserted, and the knee brought to full extension with compression.  The patellar button was clamped into place, and excess cement  removed. While the cement cured the wound was irrigated out with normal saline solution pulse lavage, and medium Hemovac drains were placed from an anterolateral  approach. Ligament stability and patellar tracking were checked and found to be excellent. The parapatellar arthrotomy was closed with  running #1 Vicryl suture. The subcutaneous tissue with 0 and 2-0 undyed  Vicryl suture, and the skin with skin staples. A dressing of Xeroform,  4 x 4, dressing sponges, Webril, and Ace wrap applied. The patient    awakened, extubated, and taken to recovery room without difficulty.   Gurshan Settlemire J 02/28/2012, 8:51 AM

## 2012-02-28 NOTE — Anesthesia Postprocedure Evaluation (Signed)
  Anesthesia Post-op Note  Patient: Christy Preston  Procedure(s) Performed: Procedure(s) (LRB) with comments: TOTAL KNEE ARTHROPLASTY (Left)  Patient Location: PACU  Anesthesia Type: GA combined with regional for post-op pain  Level of Consciousness: awake, alert  and oriented  Airway and Oxygen Therapy: Patient Spontanous Breathing and Patient connected to nasal cannula oxygen  Post-op Pain: mild  Post-op Assessment: Post-op Vital signs reviewed  Post-op Vital Signs: Reviewed  Complications: No apparent anesthesia complications

## 2012-02-28 NOTE — Anesthesia Procedure Notes (Signed)
Anesthesia Regional Block:  Femoral nerve block  Pre-Anesthetic Checklist: ,, timeout performed, Correct Patient, Correct Site, Correct Laterality, Correct Procedure, Correct Position, site marked, Risks and benefits discussed,  Surgical consent,  Pre-op evaluation,  At surgeon's request and post-op pain management  Laterality: Left and Lower  Prep: chloraprep       Needles:  Injection technique: Single-shot  Needle Type: Echogenic Needle     Needle Length: 9cm  Needle Gauge: 21    Additional Needles:  Procedures: ultrasound guided Femoral nerve block Narrative:  Start time: 02/28/2012 7:20 AM End time: 02/28/2012 7:27 AM Injection made incrementally with aspirations every 5 mL.  Performed by: Personally  Anesthesiologist: Sheldon Silvan, MD  Additional Notes: Marcaine 0.5% with EPI 1:200000  Femoral nerve block

## 2012-02-28 NOTE — Plan of Care (Signed)
Problem: Consults Goal: Diagnosis- Total Joint Replacement Primary Total Knee LEFT     

## 2012-02-28 NOTE — Anesthesia Preprocedure Evaluation (Signed)
Anesthesia Evaluation  Patient identified by MRN, date of birth, ID band Patient awake    Reviewed: Allergy & Precautions, H&P , NPO status , Patient's Chart, lab work & pertinent test results  Airway Mallampati: II TM Distance: >3 FB Neck ROM: Full    Dental  (+) Teeth Intact and Dental Advisory Given   Pulmonary  breath sounds clear to auscultation        Cardiovascular Rhythm:Regular Rate:Normal     Neuro/Psych    GI/Hepatic   Endo/Other    Renal/GU      Musculoskeletal   Abdominal   Peds  Hematology   Anesthesia Other Findings   Reproductive/Obstetrics                           Anesthesia Physical Anesthesia Plan  ASA: II  Anesthesia Plan: General   Post-op Pain Management:    Induction: Intravenous  Airway Management Planned: Oral ETT  Additional Equipment:   Intra-op Plan:   Post-operative Plan:   Informed Consent: I have reviewed the patients History and Physical, chart, labs and discussed the procedure including the risks, benefits and alternatives for the proposed anesthesia with the patient or authorized representative who has indicated his/her understanding and acceptance.   Dental advisory given  Plan Discussed with: CRNA, Anesthesiologist and Surgeon  Anesthesia Plan Comments:         Anesthesia Quick Evaluation

## 2012-02-28 NOTE — Interval H&P Note (Signed)
History and Physical Interval Note:  02/28/2012 7:17 AM  Christy Preston  has presented today for surgery, with the diagnosis of OSTEOARTHRITIS LEFT KNEE  The various methods of treatment have been discussed with the patient and family. After consideration of risks, benefits and other options for treatment, the patient has consented to  Procedure(s) (LRB) with comments: TOTAL KNEE ARTHROPLASTY (Left) as a surgical intervention .  The patient's history has been reviewed, patient examined, no change in status, stable for surgery.  I have reviewed the patient's chart and labs.  Questions were answered to the patient's satisfaction.     Nestor Lewandowsky

## 2012-02-28 NOTE — Progress Notes (Signed)
Orthopedic Tech Progress Note Patient Details:  Christy Preston 25-Nov-1940 [id number] CPM applied to Left knee with appropriate settings. OHF with trapeze bar applied to patient bed. CPM Left Knee CPM Left Knee: On Left Knee Flexion (Degrees): 60  Left Knee Extension (Degrees): 0    [name] 02/28/2012, 9:53 AM

## 2012-02-28 NOTE — Transfer of Care (Signed)
Immediate Anesthesia Transfer of Care Note  Patient: Christy Preston  Procedure(s) Performed: Procedure(s) (LRB) with comments: TOTAL KNEE ARTHROPLASTY (Left)  Patient Location: PACU  Anesthesia Type: General  Level of Consciousness: awake, alert  and oriented  Airway & Oxygen Therapy: Patient connected to nasal cannula oxygen  Post-op Assessment: Report given to PACU RN and Post -op Vital signs reviewed and stable  Post vital signs: Reviewed and stable  Complications: No apparent anesthesia complications

## 2012-02-28 NOTE — Progress Notes (Signed)
UR COMPLETED  

## 2012-02-29 ENCOUNTER — Encounter (HOSPITAL_COMMUNITY): Payer: Self-pay | Admitting: Orthopedic Surgery

## 2012-02-29 LAB — BASIC METABOLIC PANEL
BUN: 8 mg/dL (ref 6–23)
CO2: 24 mEq/L (ref 19–32)
Chloride: 101 mEq/L (ref 96–112)
Creatinine, Ser: 0.58 mg/dL (ref 0.50–1.10)
Glucose, Bld: 141 mg/dL — ABNORMAL HIGH (ref 70–99)

## 2012-02-29 LAB — CBC
HCT: 33.6 % — ABNORMAL LOW (ref 36.0–46.0)
Hemoglobin: 11.2 g/dL — ABNORMAL LOW (ref 12.0–15.0)
MCV: 88.9 fL (ref 78.0–100.0)
RDW: 12.9 % (ref 11.5–15.5)
WBC: 7.5 10*3/uL (ref 4.0–10.5)

## 2012-02-29 NOTE — Progress Notes (Signed)
Physical Therapy Treatment Patient Details Name: Christy Preston MRN: [id number] DOB: 1940/11/15 Today's Date: 02/29/2012 Time: 1033-1100 PT Time Calculation (min): 27 min  PT Assessment / Plan / Recommendation Comments on Treatment Session  Pt continues to progress well with ambulation, increasing distance from first session and did very well with exercises.      Follow Up Recommendations  Home health PT    Barriers to Discharge None      Equipment Recommendations  Rolling walker with 5" wheels    Recommendations for Other Services OT consult  Frequency 7X/week   Plan Discharge plan remains appropriate    Precautions / Restrictions Precautions Precautions: Knee Restrictions Weight Bearing Restrictions: No Other Position/Activity Restrictions: WBAT   Pertinent Vitals/Pain 5/10    Mobility  Bed Mobility Bed Mobility: Supine to Sit;Sit to Supine Supine to Sit: 5: Supervision;HOB elevated Sitting - Scoot to Edge of Bed: 5: Supervision Sit to Supine: 4: Min assist Details for Bed Mobility Assistance: Some assist from husband for LLE into bed with min cues for safety.  Transfers Transfers: Sit to Stand;Stand to Sit Sit to Stand: 5: Supervision;From elevated surface;With upper extremity assist;From bed Stand to Sit: 5: Supervision;With upper extremity assist;To bed Details for Transfer Assistance: Supervision and cues for safety and hand placement.  Ambulation/Gait Ambulation/Gait Assistance: 4: Min guard Ambulation Distance (Feet): 120 Feet Assistive device: Rolling walker Ambulation/Gait Assistance Details: Min cues for equal step length and safety when turning.  Gait Pattern: Step-to pattern;Decreased stance time - left;Decreased step length - right Gait velocity: decreased Stairs: No Wheelchair Mobility Wheelchair Mobility: No    Exercises Total Joint Exercises Ankle Circles/Pumps: AROM;Both;20 reps Quad Sets: AROM;Left;10 reps (with towel roll under ankle) Short  Arc Quad: AAROM;Left;10 reps Heel Slides: AAROM;Left;10 reps Hip ABduction/ADduction: AAROM;Left;10 reps Straight Leg Raises: AAROM;Left;10 reps   PT Diagnosis: Difficulty walking;Generalized weakness;Acute pain  PT Problem List: Decreased strength;Decreased range of motion;Decreased activity tolerance;Decreased mobility;Decreased balance;Decreased knowledge of use of DME;Pain PT Treatment Interventions: DME instruction;Gait training;Stair training;Functional mobility training;Therapeutic activities;Therapeutic exercise;Balance training;Patient/family education   PT Goals Acute Rehab PT Goals PT Goal Formulation: With patient Time For Goal Achievement: 03/03/12 Potential to Achieve Goals: Good Pt will go Sit to Stand: with modified independence PT Goal: Sit to Stand - Progress: Progressing toward goal Pt will Ambulate: 51 - 150 feet;with modified independence;with least restrictive assistive device PT Goal: Ambulate - Progress: Progressing toward goal Pt will Go Up / Down Stairs: 3-5 stairs;with supervision;with least restrictive assistive device;with rail(s) PT Goal: Up/Down Stairs - Progress: Goal set today Pt will Perform Home Exercise Program: with supervision, verbal cues required/provided PT Goal: Perform Home Exercise Program - Progress: Progressing toward goal  Visit Information  Last PT Received On: 02/29/12 Assistance Needed: +1    Subjective Data  Subjective: Sure I'll walk again Patient Stated Goal: to return home   Cognition  Overall Cognitive Status: Appears within functional limits for tasks assessed/performed Arousal/Alertness: Awake/alert Orientation Level: Appears intact for tasks assessed Behavior During Session: [facility] for tasks performed    Balance     End of Session PT - End of Session Equipment Utilized During Treatment: Gait belt Activity Tolerance: Patient tolerated treatment well Patient left: in bed;with call bell/phone within reach;with  family/visitor present Nurse Communication: Other (comment) (Request for going to restroom)   GP     [name], [name] 02/29/2012, 11:10 AM

## 2012-02-29 NOTE — Progress Notes (Signed)
Occupational Therapy Discharge Patient Details Name: Christy Preston MRN: [id number] DOB: 1941-03-27 Today's Date: 02/29/2012 Time: 4098-1191 OT Time Calculation (min): 27 min  Patient discharged from OT services secondary to goals met and no further OT needs identified.  Please see latest therapy progress note for current level of functioning and progress toward goals.    Progress and discharge plan discussed with patient and/or caregiver: Patient/Caregiver agrees with plan  GO     Lucile Shutters Pager: [phone]  02/29/2012, 2:47 PM

## 2012-02-29 NOTE — Clinical Social Work Note (Signed)
CSW received consult order as part of knee replacement order-set. CSW reviewed chart, PT/OT notes, and discuss Pt in multidisciplinary meeting. No obvious CSW needs, CSW signing off.   Frederico Hamman, [place] [phone] Covering for Ortho CSW Lovette Cliche, [place]

## 2012-02-29 NOTE — Evaluation (Signed)
Occupational Therapy Evaluation Patient Details Name: Christy Preston MRN: [id number] DOB: 11/26/1940 Today's Date: 02/29/2012 Time: 4098-1191 OT Time Calculation (min): 27 min  OT Assessment / Plan / Recommendation Clinical Impression  71 yo female s/p Lt TKA progressing well and does not require skilled OT acutely. Ot to sign off .    OT Assessment  Patient does not need any further OT services    Follow Up Recommendations  No OT follow up    Barriers to Discharge      Equipment Recommendations  Rolling walker with 5" wheels;3 in 1 bedside comode    Recommendations for Other Services    Frequency       Precautions / Restrictions Precautions Precautions: Knee Restrictions Weight Bearing Restrictions: No Other Position/Activity Restrictions: WBAT   Pertinent Vitals/Pain  Pt describes as "sore" and no medication needed at this time Pt repositioned and reports comfort in that position    ADL  Grooming: Performed;Wash/dry hands;Modified independent Where Assessed - Grooming: Unsupported standing Toilet Transfer: Performed;Modified independent Toilet Transfer Method: Sit to stand Toilet Transfer Equipment: Raised toilet seat with arms (or 3-in-1 over toilet) Toileting - Clothing Manipulation and Hygiene: Performed;Modified independent Where Assessed - Toileting Clothing Manipulation and Hygiene: Sit to stand from 3-in-1 or toilet Tub/Shower Transfer: Performed;Supervision/safety Tub/Shower Transfer Method: Science writer: Other (comment) (tub with 3n1 as bench) Equipment Used: Gait belt;Rolling walker Transfers/Ambulation Related to ADLs: Pt ambulated >75 ft with OT surpassing PT session. Pt ambulated Mod I with RW without and safety errors ADL Comments: Pt educated on hand placement with RW. Pt initally attempting to pull up on RW. Pt provided min v/c and corrected errors the remainder of the session. pt able to touch toes . Pt educated on long  sitting to don underwear and stand to pull up garments. Pt educated on all transfers in the bathroom and the bed. Pt all education completed and adequate levle for d/c home    OT Diagnosis:    OT Problem List:   OT Treatment Interventions:     OT Goals    Visit Information  Last OT Received On: 02/29/12 Assistance Needed: +1    Subjective Data  Subjective: "so today I do the tub and tomorrow I do the steps?"- pt attempting to understand OT vs PT sessions Patient Stated Goal: to do the steps next   Prior Functioning  Vision/Perception  Home Living Lives With: Spouse Available Help at Discharge: Family Type of Home: House Home Access: Stairs to enter Secretary/administrator of Steps: 3 Entrance Stairs-Rails: Right Home Layout: Able to live on main level with bedroom/bathroom;Two level Bathroom Shower/Tub: Forensic psychologist: Shower chair without back;Bedside commode/3-in-1;Walker - rolling Prior Function Level of Independence: Independent with assistive device(s) Able to Take Stairs?: Yes Driving: Yes Vocation: Retired Musician: No difficulties Dominant Hand: Right      Cognition  Overall Cognitive Status: Appears within functional limits for tasks assessed/performed Arousal/Alertness: Awake/alert Orientation Level: Appears intact for tasks assessed Behavior During Session: [facility] for tasks performed    Extremity/Trunk Assessment Right Upper Extremity Assessment RUE ROM/Strength/Tone: Within functional levels Left Upper Extremity Assessment LUE ROM/Strength/Tone: Within functional levels   Mobility  Shoulder Instructions  Bed Mobility Bed Mobility: Supine to Sit;Sitting - Scoot to Edge of Bed Supine to Sit: 6: Modified independent (Device/Increase time);HOB flat Sitting - Scoot to Edge of Bed: 6: Modified independent (Device/Increase time) Sit to Supine: 4: Min assist Details for Bed  Mobility  Assistance: pt able to use BIL UE to (A) Lt LE to EOB.  Transfers Sit to Stand: 5: Supervision;With upper extremity assist;From bed Stand to Sit: 5: Supervision;With upper extremity assist;To chair/3-in-1 Details for Transfer Assistance: v.c for hand placement initially and progressed to correct safe placement       Exercise Total Joint Exercises Ankle Circles/Pumps: AROM;Both;20 reps Quad Sets: AROM;Left;10 reps (with towel roll under ankle) Short Arc Quad: AAROM;Left;10 reps Heel Slides: AAROM;Left;10 reps Hip ABduction/ADduction: AAROM;Left;10 reps Straight Leg Raises: AAROM;Left;10 reps   Balance     End of Session OT - End of Session Activity Tolerance: Patient tolerated treatment well Patient left: in chair;with call bell/phone within reach Nurse Communication: Mobility status  GO     Harrel Carina [facility] 02/29/2012, 2:46 PM Pager: [phone]

## 2012-02-29 NOTE — Evaluation (Signed)
Physical Therapy Evaluation Patient Details Name: RAYNEISHA ESCARENO MRN: [id number] DOB: 01-Jan-1941 Today's Date: 02/29/2012 Time: 4098-1191 PT Time Calculation (min): 24 min  PT Assessment / Plan / Recommendation Clinical Impression  Pt presents s/p L TKA POD 1 with decreased strength, ROM and mobility.  Tolerated OOB and ambulation in hallway very well with no increase c/o pain, dizziness/lightheadedness during ambulation.  Pt will benefit from skilled PT in acute venue to address mobility deficits.  PT recommends HHPT for follow up at D/C to return pt to prior level of functioning.     PT Assessment  Patient needs continued PT services    Follow Up Recommendations  Home health PT    Barriers to Discharge None      Equipment Recommendations  Rolling walker with 5" wheels    Recommendations for Other Services OT consult   Frequency 7X/week    Precautions / Restrictions Precautions Precautions: Knee Restrictions Weight Bearing Restrictions: No Other Position/Activity Restrictions: WBAT   Pertinent Vitals/Pain 5-7/10, repositioned, ice pack applied      Mobility  Bed Mobility Bed Mobility: Supine to Sit;Sitting - Scoot to Edge of Bed Supine to Sit: 5: Supervision;HOB elevated Sitting - Scoot to Edge of Bed: 5: Supervision Details for Bed Mobility Assistance: Supervision and cues for safety and technique.  Transfers Transfers: Sit to Stand;Stand to Sit Sit to Stand: 4: Min guard;From elevated surface;With upper extremity assist;From bed Stand to Sit: 4: Min guard;With upper extremity assist;With armrests;To chair/3-in-1 Details for Transfer Assistance: Provided cues for hand placement, safety and LE management when sitting/standing.  Ambulation/Gait Ambulation/Gait Assistance: 4: Min guard Ambulation Distance (Feet): 80 Feet Assistive device: Rolling walker Ambulation/Gait Assistance Details: Cues for sequencing/technique with RW.  Pt demos good posture throughout  ambulation.  Gait Pattern: Step-to pattern;Decreased stance time - left;Decreased step length - right Gait velocity: decreased Stairs: No Wheelchair Mobility Wheelchair Mobility: No    Exercises     PT Diagnosis: Difficulty walking;Generalized weakness;Acute pain  PT Problem List: Decreased strength;Decreased range of motion;Decreased activity tolerance;Decreased mobility;Decreased balance;Decreased knowledge of use of DME;Pain PT Treatment Interventions: DME instruction;Gait training;Stair training;Functional mobility training;Therapeutic activities;Therapeutic exercise;Balance training;Patient/family education   PT Goals Acute Rehab PT Goals PT Goal Formulation: With patient Time For Goal Achievement: 03/03/12 Potential to Achieve Goals: Good Pt will go Sit to Stand: with modified independence PT Goal: Sit to Stand - Progress: Goal set today Pt will Ambulate: 51 - 150 feet;with modified independence;with least restrictive assistive device PT Goal: Ambulate - Progress: Goal set today Pt will Go Up / Down Stairs: 3-5 stairs;with supervision;with least restrictive assistive device;with rail(s) PT Goal: Up/Down Stairs - Progress: Goal set today Pt will Perform Home Exercise Program: with supervision, verbal cues required/provided PT Goal: Perform Home Exercise Program - Progress: Goal set today  Visit Information  Last PT Received On: 02/29/12 Assistance Needed: +1    Subjective Data  Subjective: I'm ready to get going Patient Stated Goal: to return home   Prior Functioning  Home Living Lives With: Spouse Available Help at Discharge: Family Type of Home: House Home Access: Stairs to enter Secretary/administrator of Steps: 3 Entrance Stairs-Rails: Right Home Layout: Able to live on main level with bedroom/bathroom;Two level Bathroom Shower/Tub: Forensic psychologist: Shower chair without back;Bedside commode/3-in-1;Walker -  rolling Prior Function Level of Independence: Independent with assistive device(s) Able to Take Stairs?: Yes Driving: Yes Vocation: Retired Musician: No difficulties    Cognition  Overall Cognitive Status:  Appears within functional limits for tasks assessed/performed Arousal/Alertness: Awake/alert Orientation Level: Appears intact for tasks assessed Behavior During Session: [facility] for tasks performed    Extremity/Trunk Assessment Right Lower Extremity Assessment RLE ROM/Strength/Tone: [facility] for tasks assessed RLE Sensation: WFL - Light Touch RLE Coordination: WFL - gross/fine motor Left Lower Extremity Assessment LLE ROM/Strength/Tone: Deficits LLE ROM/Strength/Tone Deficits: able to initiate SLR with some assistance from therapist, knee flex approx 30 deg LLE Sensation: WFL - Light Touch Trunk Assessment Trunk Assessment: Normal   Balance    End of Session PT - End of Session Equipment Utilized During Treatment: Gait belt Activity Tolerance: Patient tolerated treatment well Patient left: in chair;with call bell/phone within reach Nurse Communication: Mobility status  GP     [name], [name] 02/29/2012, 8:39 AM

## 2012-02-29 NOTE — Progress Notes (Signed)
[facility] PATIENT ID: Christy Preston  MRN: [id number]  DOB/AGE:  71-19-1942 / 71 y.o.  1 Day Post-Op Procedure(s) (LRB): TOTAL KNEE ARTHROPLASTY (Left)    PROGRESS NOTE Subjective: Patient is alert, oriented, no Nausea, no Vomiting, yes passing gas, no Bowel Movement. Taking PO well. Denies SOB, Chest or Calf Pain. Using Incentive Spirometer, PAS in place. Ambulate WBAT, CPM 0-30 Patient reports pain as 3 on 0-10 scale  .    Objective: Vital signs in last 24 hours: Filed Vitals:   02/28/12 1529 02/28/12 1719 02/28/12 2303 02/29/12 0643  BP:  106/61 131/64 106/47  Pulse:  76 86 60  Temp:  97.9 F (36.6 C) 98.8 F (37.1 C) 98 F (36.7 C)  TempSrc:      Resp:  16 18 18   Height: 5' 1.81" (1.57 m)     Weight: 62.7 kg (138 lb 3.7 oz)     SpO2:  99% 98% 97%      Intake/Output from previous day: I/O last 3 completed shifts: In: 3495 [P.O.:840; I.V.:2600; IV Piggyback:55] Out: 1460 [Urine:1100; Drains:350; Blood:10]   Intake/Output this shift:     LABORATORY DATA:  Basename 02/29/12 0540  WBC 7.5  HGB 11.2*  HCT 33.6*  PLT 235  NA 133*  K 4.5  CL 101  CO2 24  BUN 8  CREATININE 0.58  GLUCOSE 141*  GLUCAP --  INR --  CALCIUM 8.7    Examination: Neurologically intact ABD soft Neurovascular intact Sensation intact distally Intact pulses distally Dorsiflexion/Plantar flexion intact Incision: no drainage No cellulitis present Compartment soft} Blood and plasma separated in drain indicating minimal recent drainage, drain pulled without difficulty.  Assessment:   1 Day Post-Op Procedure(s) (LRB): TOTAL KNEE ARTHROPLASTY (Left) ADDITIONAL DIAGNOSIS:    Plan: PT/OT WBAT, CPM 5/hrs day until ROM 0-90 degrees, then D/C CPM DVT Prophylaxis:  SCDx72hrs, ASA 325 mg BID x 2 weeks DISCHARGE PLAN: Home DISCHARGE NEEDS: HHPT, HHRN, CPM, Walker and 3-in-1 comode seat     Christy Preston J 02/29/2012, 7:16 AM   nt ID: Christy Preston, female   DOB: 1940-08-08, 71 y.o.   MRN:  [id number]

## 2012-03-01 DIAGNOSIS — M1712 Unilateral primary osteoarthritis, left knee: Secondary | ICD-10-CM | POA: Diagnosis present

## 2012-03-01 LAB — CBC
HCT: 35.8 % — ABNORMAL LOW (ref 36.0–46.0)
Hemoglobin: 12.3 g/dL (ref 12.0–15.0)
MCH: 30.2 pg (ref 26.0–34.0)
MCHC: 34.4 g/dL (ref 30.0–36.0)
MCV: 88 fL (ref 78.0–100.0)
RDW: 12.8 % (ref 11.5–15.5)

## 2012-03-01 MED ORDER — ASPIRIN 325 MG PO TBEC: 325.0000 mg | DELAYED_RELEASE_TABLET | Freq: Two times a day (BID) | ORAL | Status: DC

## 2012-03-01 MED ORDER — METHOCARBAMOL 500 MG PO TABS: 500.0000 mg | ORAL_TABLET | Freq: Four times a day (QID) | ORAL | Status: DC | PRN

## 2012-03-01 MED ORDER — OXYCODONE-ACETAMINOPHEN 5-325 MG PO TABS: 1.0000 | ORAL_TABLET | ORAL | Status: DC | PRN

## 2012-03-01 NOTE — Progress Notes (Signed)
CARE MANAGEMENT NOTE 03/01/2012  Patient:  AZALEA, CEDAR   Account Number:  [id number]  Date Initiated:  03/01/2012  Documentation initiated by:  Vance Peper  Subjective/Objective Assessment:   71 yr old female s/p Left total hip arthroplasty.     Action/Plan:   Patient preoperatively setup with [facility], no changes. DME has been delivered.   Anticipated DC Date:  03/01/2012   Anticipated DC Plan:  HOME W HOME HEALTH SERVICES      DC Planning Services  CM consult      [facility] Choice  HOME HEALTH   Choice offered to / List presented to:  C-1 Patient        HH arranged  HH-2 PT      [facility] agency  [facility]   Status of service:  Completed, signed off Medicare Important Message given?   (If response is "NO", the following Medicare IM given date fields will be blank) Date Medicare IM given:   Date Additional Medicare IM given:    Discharge Disposition:  HOME W HOME HEALTH SERVICES  Per UR Regulation:    If discussed at Long Length of Stay Meetings, dates discussed:    Comments:

## 2012-03-01 NOTE — Discharge Summary (Signed)
Patient ID: Christy Preston MRN: [id number] DOB/AGE: 11/01/1940 71 y.o.  Admit date: 02/28/2012 Discharge date: 03/01/2012  Admission Diagnoses:  Principal Problem:  *Osteoarthritis of left knee   Discharge Diagnoses:  Same  Past Medical History  Diagnosis Date  . Diverticulosis of colon   . Arthritis     Surgeries: Procedure(s): TOTAL KNEE ARTHROPLASTY on 02/28/2012   Consultants:    Discharged Condition: Improved  Hospital Course: Christy Preston is an 71 y.o. female who was admitted 02/28/2012 for operative treatment ofOsteoarthritis of left knee. Patient has severe unremitting pain that affects sleep, daily activities, and work/hobbies. After pre-op clearance the patient was taken to the operating room on 02/28/2012 and underwent  Procedure(s): TOTAL KNEE ARTHROPLASTY.    Patient was given perioperative antibiotics: Anti-infectives     Start     Dose/Rate Route Frequency Ordered Stop   02/28/12 0804   cefUROXime (ZINACEF) injection  Status:  Discontinued          As needed 02/28/12 0804 02/28/12 0911   02/27/12 1433   ceFAZolin (ANCEF) IVPB 2 g/50 mL premix        2 g 100 mL/hr over 30 Minutes Intravenous 60 min pre-op 02/27/12 1433 02/28/12 0730           Patient was given sequential compression devices, early ambulation, and chemoprophylaxis to prevent DVT.  Patient benefited maximally from hospital stay and there were no complications.    Recent vital signs: Patient Vitals for the past 24 hrs:  BP Temp Temp src Pulse Resp SpO2  03/01/12 0712 133/83 mmHg 99.3 F (37.4 C) Oral 88  16  96 %  03-29-12 2115 130/69 mmHg 98.4 F (36.9 C) Oral 85  18  96 %  2012-03-29 1300 105/67 mmHg 98 F (36.7 C) - 83  18  97 %  2012-03-29 1230 110/60 mmHg 98.7 F (37.1 C) Oral 76  18  -     Recent laboratory studies:  Basename 03/01/12 0553 Mar 29, 2012 0540  WBC 10.6* 7.5  HGB 12.3 11.2*  HCT 35.8* 33.6*  PLT 267 235  NA -- 133*  K -- 4.5  CL -- 101  CO2 -- 24  BUN -- 8    CREATININE -- 0.58  GLUCOSE -- 141*  INR -- --  CALCIUM -- 8.7     Discharge Medications:     Medication List     As of 03/01/2012  8:15 AM    STOP taking these medications         ANACIN PO      TAKE these medications         aspirin 325 MG EC tablet   Take 1 tablet (325 mg total) by mouth 2 (two) times daily.      CALCIUM LACTATE PO   Take 1 tablet by mouth 3 (three) times daily with meals.      CRANBERRY PO   Take 1 tablet by mouth daily.      FIBER PO   Take 1 tablet by mouth 3 (three) times daily with meals.      methocarbamol 500 MG tablet   Commonly known as: ROBAXIN   Take 1 tablet (500 mg total) by mouth every 6 (six) hours as needed.      oxyCODONE-acetaminophen 5-325 MG per tablet   Commonly known as: PERCOCET/ROXICET   Take 1-2 tablets by mouth every 4 (four) hours as needed for pain.        Diagnostic Studies: Dg Chest  2 View  02/18/2012  *RADIOLOGY REPORT*  Clinical Data: Preoperative evaluation for total knee replacement  CHEST - 2 VIEW  Comparison: None.  Findings: Heart size is upper limits of normal and a normal mediastinal contour is seen.  The lung fields demonstrate prominence of the interstitial markings diffusely throughout the lung fields compatible with underlying bronchitic change.  Some mild central peribronchial cuffing is seen.  No focal infiltrates or signs of congestive failure are identified.  Bony structures appear intact.  IMPRESSION: Findings suggesting underlying chronic bronchitic change.  No worrisome focal or acute abnormality identified   Original Report Authenticated By: Bertha Stakes, M.D.     Disposition:       Discharge Orders    Future Orders Please Complete By Expires   Increase activity slowly      Walker       May shower / Bathe      Driving Restrictions      Comments:   No driving for 2 weeks.   Change dressing (specify)      Comments:   Dressing change as needed.   Call MD for:  temperature >100.4       Call MD for:  severe uncontrolled pain      Call MD for:  redness, tenderness, or signs of infection (pain, swelling, redness, odor or green/yellow discharge around incision site)      Discharge instructions      Comments:   F/U with Dr. Turner Daniels as scheduled         Signed: Hazle Nordmann. 03/01/2012, 8:15 AM

## 2012-03-01 NOTE — Progress Notes (Signed)
PATIENT ID: Christy Preston  MRN: [id number]  DOB/AGE:  1940-06-08 / 71 y.o.  2 Days Post-Op Procedure(s) (LRB): TOTAL KNEE ARTHROPLASTY (Left)    PROGRESS NOTE Subjective: Patient is alert, oriented, no Nausea, no Vomiting, yes passing gas, no Bowel Movement. Taking PO well. Denies SOB, Chest or Calf Pain. Using Incentive Spirometer, PAS in place. Ambulating well with PT. Patient reports pain as moderate  .    Objective: Vital signs in last 24 hours: Filed Vitals:   02/29/12 1230 02/29/12 1300 02/29/12 2115 03/01/12 0712  BP: 110/60 105/67 130/69 133/83  Pulse: 76 83 85 88  Temp: 98.7 F (37.1 C) 98 F (36.7 C) 98.4 F (36.9 C) 99.3 F (37.4 C)  TempSrc: Oral  Oral Oral  Resp: 18 18 18 16   Height:      Weight:      SpO2:  97% 96% 96%      Intake/Output from previous day: I/O last 3 completed shifts: In: 1860 [P.O.:360; I.V.:1500] Out: 1200 [Urine:1150; Drains:50]   Intake/Output this shift:     LABORATORY DATA:  Basename 03/01/12 0553 02/29/12 0540  WBC 10.6* 7.5  HGB 12.3 11.2*  HCT 35.8* 33.6*  PLT 267 235  NA -- 133*  K -- 4.5  CL -- 101  CO2 -- 24  BUN -- 8  CREATININE -- 0.58  GLUCOSE -- 141*  GLUCAP -- --  INR -- --  CALCIUM -- 8.7    Examination: Neurologically intact ABD soft Neurovascular intact Sensation intact distally Intact pulses distally Dorsiflexion/Plantar flexion intact Incision: dressing C/D/I}  Assessment:   2 Days Post-Op Procedure(s) (LRB): TOTAL KNEE ARTHROPLASTY (Left) ADDITIONAL DIAGNOSIS:  none  Plan: PT/OT WBAT, CPM 5/hrs day until ROM 0-90 degrees, then D/C CPM DVT Prophylaxis:  SCDx72hrs, ASA 325 mg BID x 2 weeks DISCHARGE PLAN: Home today DISCHARGE NEEDS: HHPT, HHRN, CPM, Walker and 3-in-1 comode seat     Madisin Hasan M. 03/01/2012, 8:12 AM

## 2012-03-01 NOTE — Progress Notes (Signed)
Physical Therapy Treatment and Discharge Patient Details Name: Christy Preston MRN: [id number] DOB: 08/07/1940 Today's Date: 03/01/2012 Time: 1194-1740 PT Time Calculation (min): 33 min  PT Assessment / Plan / Recommendation Comments on Treatment Session  Pt has progressed well and is ready for d/c home today and transger to HHPT.  Pt has met acute goals, d/c from acute PT.    Follow Up Recommendations  Home health PT    Barriers to Discharge        Equipment Recommendations  Rolling walker with 5" wheels;3 in 1 bedside comode    Recommendations for Other Services    Frequency     Plan All goals met and education completed, patient dischaged from PT services    Precautions / Restrictions Precautions Precautions: Knee Precaution Booklet Issued: No Precaution Comments: pt able to verbalize and demonstrate precaution Restrictions Weight Bearing Restrictions: Yes Other Position/Activity Restrictions: WBAT   Pertinent Vitals/Pain 4/10 left knee pain, premedicated    Mobility  Bed Mobility Bed Mobility: Supine to Sit;Sitting - Scoot to Edge of Bed Supine to Sit: 6: Modified independent (Device/Increase time);HOB flat Sitting - Scoot to Edge of Bed: 6: Modified independent (Device/Increase time) Details for Bed Mobility Assistance: pt able to use UE's to assist leg in getting to EOB and able to sit straight up in bed from flat position. Educated pt on using UE's as little as possible to continue to strengthen leg  Transfers Transfers: Sit to Stand;Stand to Sit Sit to Stand: 6: Modified independent (Device/Increase time);With upper extremity assist;From bed Stand to Sit: 6: Modified independent (Device/Increase time);To chair/3-in-1;With upper extremity assist Ambulation/Gait Ambulation/Gait Assistance: 6: Modified independent (Device/Increase time) Ambulation Distance (Feet): 300 Feet Assistive device: Rolling walker Ambulation/Gait Assistance Details: pt ambulating safely.   Began to work on form including heel strike and smoother gait instead of pausing after each step sequence Gait Pattern: Step-through pattern;Left flexed knee in stance Gait velocity: decreased Stairs: Yes Stairs Assistance: 6: Modified independent (Device/Increase time) Stair Management Technique: One rail Right;Step to pattern;Forwards Number of Stairs: 5  Wheelchair Mobility Wheelchair Mobility: No    Exercises Total Joint Exercises Ankle Circles/Pumps: AROM;Both;20 reps Quad Sets: AROM;Left;10 reps Short Arc Quad: 5 reps;Left;AROM;Strengthening;Seated Heel Slides: AAROM;5 reps;Left;Seated Straight Leg Raises: AAROM;Strengthening;Left;10 reps;Seated Goniometric ROM: 75 degrees AAROM knee flexion, 10 degrees extension   PT Diagnosis:    PT Problem List:   PT Treatment Interventions:     PT Goals Acute Rehab PT Goals PT Goal Formulation: With patient Time For Goal Achievement: 03/03/12 Potential to Achieve Goals: Good Pt will go Sit to Stand: with modified independence PT Goal: Sit to Stand - Progress: Met Pt will Ambulate: 51 - 150 feet;with modified independence;with least restrictive assistive device PT Goal: Ambulate - Progress: Met Pt will Go Up / Down Stairs: 3-5 stairs;with supervision;with least restrictive assistive device;with rail(s) PT Goal: Up/Down Stairs - Progress: Met Pt will Perform Home Exercise Program: with supervision, verbal cues required/provided PT Goal: Perform Home Exercise Program - Progress: Met  Visit Information  Last PT Received On: 03/01/12 Assistance Needed: +1    Subjective Data  Subjective: I get to go home today Patient Stated Goal: to return home   Cognition  Overall Cognitive Status: Appears within functional limits for tasks assessed/performed Arousal/Alertness: Awake/alert Orientation Level: Oriented X4 / Intact Behavior During Session: [facility] for tasks performed    Balance  Balance Balance Assessed: Yes Static Standing  Balance Static Standing - Balance Support: No upper extremity supported;During functional activity  Static Standing - Level of Assistance: 6: Modified independent (Device/Increase time)  End of Session PT - End of Session Equipment Utilized During Treatment: Gait belt Activity Tolerance: Patient tolerated treatment well Patient left: in chair;with call bell/phone within reach Nurse Communication: Other (comment) (request for TED hose for d/c)   GP   Lyanne Co, PT  Acute Rehab Services  [phone]   Lyanne Co 03/01/2012, 9:52 AM

## 2013-01-15 ENCOUNTER — Other Ambulatory Visit: Payer: Self-pay

## 2013-01-15 DIAGNOSIS — Z1231 Encounter for screening mammogram for malignant neoplasm of breast: Secondary | ICD-10-CM

## 2013-01-31 ENCOUNTER — Ambulatory Visit: Payer: Medicare Other

## 2013-02-27 ENCOUNTER — Ambulatory Visit
Admission: RE | Admit: 2013-02-27 | Discharge: 2013-02-27 | Disposition: A | Discharge status: Home / Self Care | Payer: Medicare Other | Source: Ambulatory Visit

## 2013-02-27 DIAGNOSIS — Z1231 Encounter for screening mammogram for malignant neoplasm of breast: Secondary | ICD-10-CM

## 2013-03-01 ENCOUNTER — Other Ambulatory Visit: Payer: Self-pay | Admitting: Family Medicine

## 2013-03-01 DIAGNOSIS — R928 Other abnormal and inconclusive findings on diagnostic imaging of breast: Secondary | ICD-10-CM

## 2013-04-10 ENCOUNTER — Ambulatory Visit
Admission: RE | Admit: 2013-04-10 | Discharge: 2013-04-10 | Disposition: A | Discharge status: Home / Self Care | Payer: Medicare Other | Source: Ambulatory Visit | Attending: Family Medicine | Admitting: Family Medicine

## 2013-04-10 DIAGNOSIS — R928 Other abnormal and inconclusive findings on diagnostic imaging of breast: Secondary | ICD-10-CM

## 2013-09-19 IMAGING — CR DG CHEST 2V
2 series · 2 of 2 positions shown · non-contrast
Comparison: None.

CLINICAL DATA: Preoperative evaluation for total knee replacement

CHEST - 2 VIEW

[view not recorded (1 of 2)]
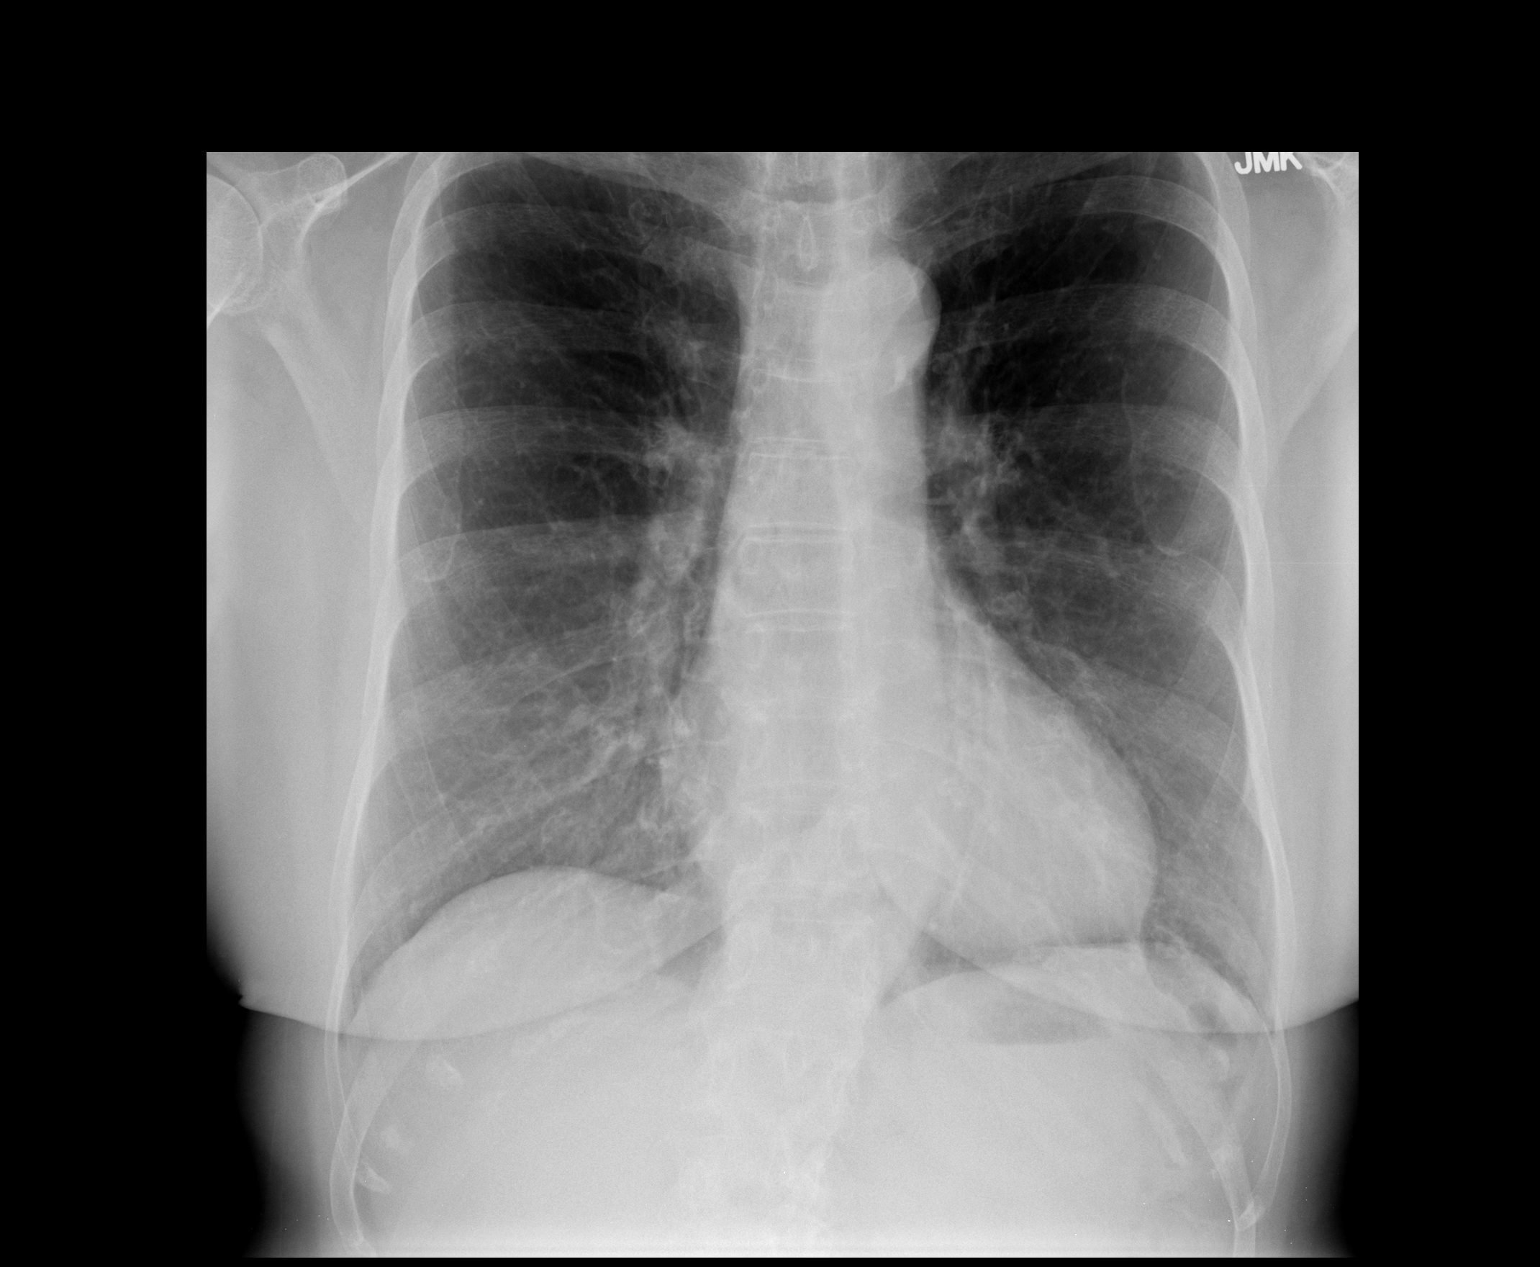

[view not recorded (2 of 2)]
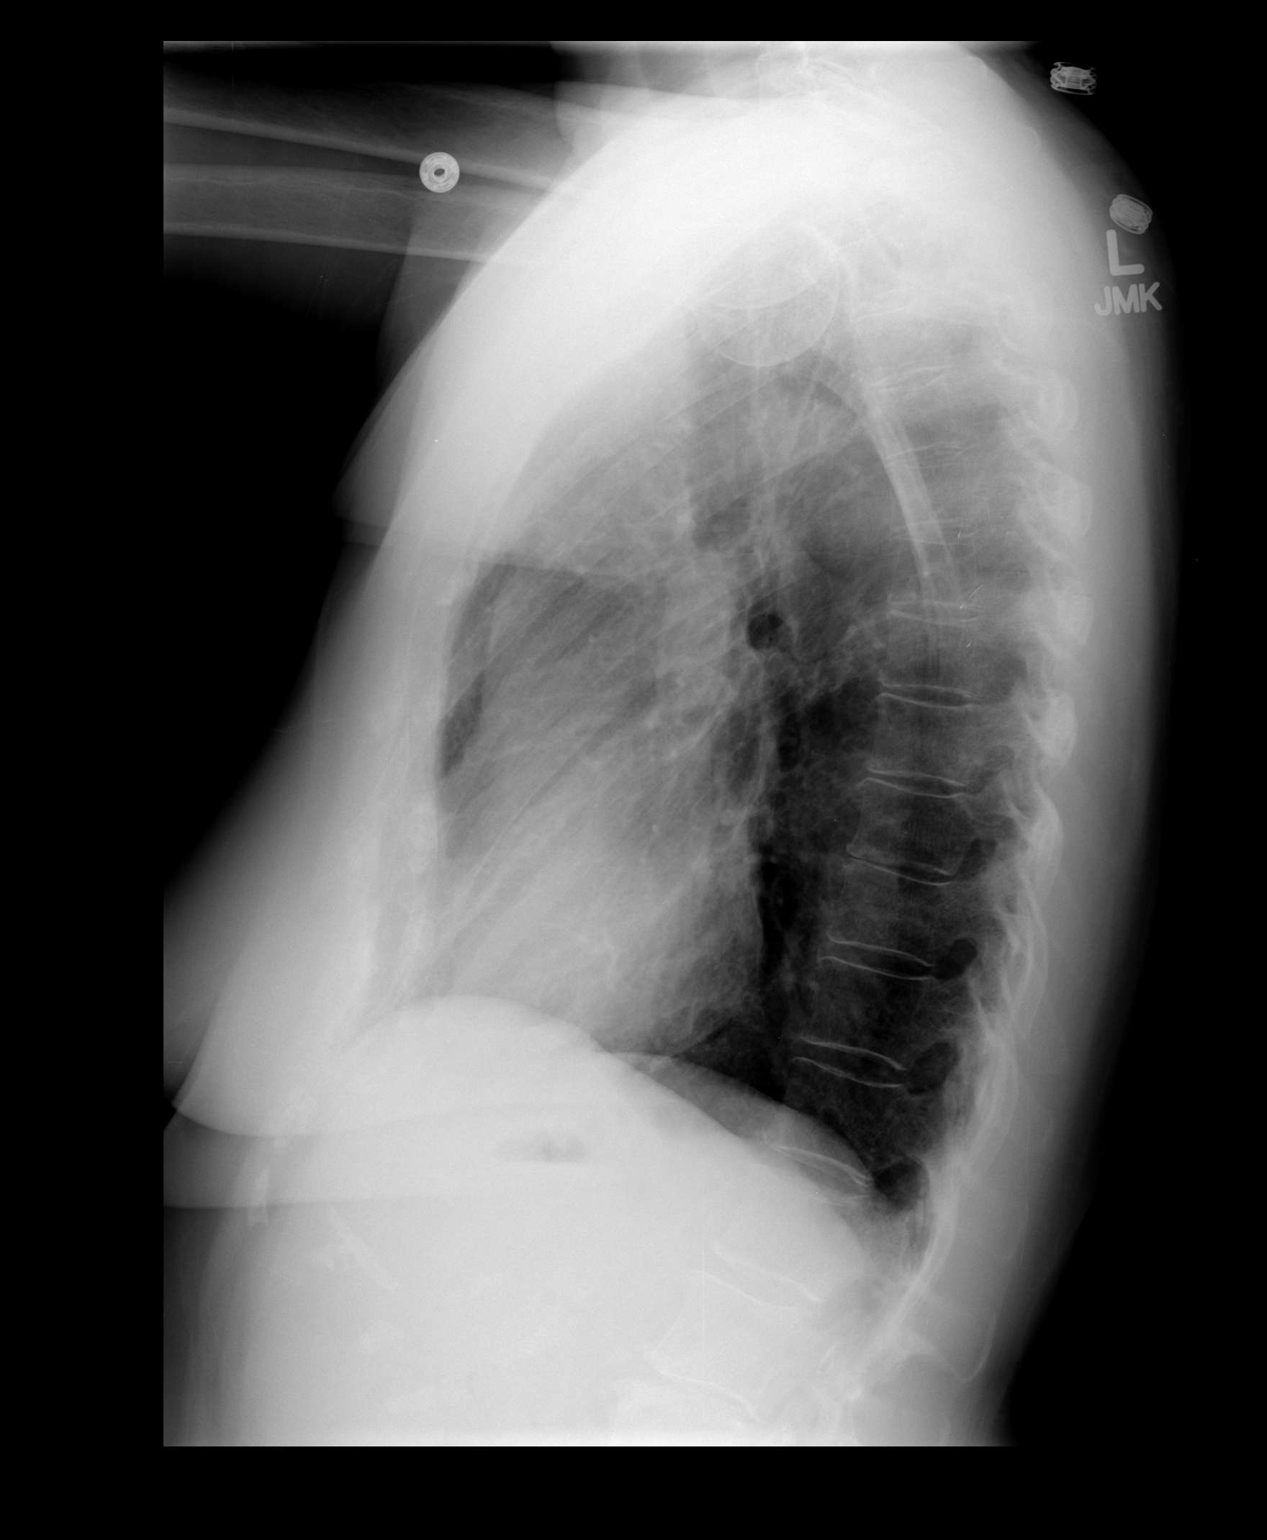

[2 of 2 positions shown; findings below may reference images not displayed]

FINDINGS: Heart size is upper limits of normal and a normal
mediastinal contour is seen.  The lung fields demonstrate
prominence of the interstitial markings diffusely throughout the
lung fields compatible with underlying bronchitic change.  Some
mild central peribronchial cuffing is seen.  No focal infiltrates
or signs of congestive failure are identified.

Bony structures appear intact.
IMPRESSION: Findings suggesting underlying chronic bronchitic change.  No
worrisome focal or acute abnormality identified

## 2014-03-08 ENCOUNTER — Other Ambulatory Visit: Payer: Self-pay

## 2014-03-08 DIAGNOSIS — Z1231 Encounter for screening mammogram for malignant neoplasm of breast: Secondary | ICD-10-CM

## 2014-03-28 ENCOUNTER — Ambulatory Visit: Payer: Medicare Other

## 2014-04-24 ENCOUNTER — Ambulatory Visit: Payer: Medicare Other

## 2014-05-14 ENCOUNTER — Ambulatory Visit
Admission: RE | Admit: 2014-05-14 | Discharge: 2014-05-14 | Disposition: A | Discharge status: Home / Self Care | Payer: Medicare Other | Source: Ambulatory Visit

## 2014-05-14 ENCOUNTER — Encounter (INDEPENDENT_AMBULATORY_CARE_PROVIDER_SITE_OTHER): Payer: Self-pay

## 2014-05-14 DIAGNOSIS — Z1231 Encounter for screening mammogram for malignant neoplasm of breast: Secondary | ICD-10-CM

## 2014-10-04 ENCOUNTER — Other Ambulatory Visit (HOSPITAL_COMMUNITY): Payer: Self-pay | Admitting: Orthopedic Surgery

## 2014-10-04 DIAGNOSIS — M25562 Pain in left knee: Secondary | ICD-10-CM

## 2014-10-10 ENCOUNTER — Encounter (HOSPITAL_COMMUNITY): Payer: Medicare Other

## 2014-10-17 ENCOUNTER — Encounter (HOSPITAL_COMMUNITY): Payer: Medicare Other

## 2014-10-17 ENCOUNTER — Ambulatory Visit (HOSPITAL_COMMUNITY): Payer: Medicare Other

## 2014-10-17 ENCOUNTER — Ambulatory Visit (HOSPITAL_COMMUNITY)
Admission: RE | Admit: 2014-10-17 | Discharge: 2014-10-17 | Disposition: A | Discharge status: Home / Self Care | Payer: Medicare Other | Source: Ambulatory Visit | Attending: Orthopedic Surgery | Admitting: Orthopedic Surgery

## 2014-10-17 ENCOUNTER — Encounter (HOSPITAL_COMMUNITY)
Admission: RE | Admit: 2014-10-17 | Discharge: 2014-10-17 | Disposition: A | Discharge status: Home / Self Care | Payer: Medicare Other | Source: Ambulatory Visit | Attending: Orthopedic Surgery | Admitting: Orthopedic Surgery

## 2014-10-17 DIAGNOSIS — M25562 Pain in left knee: Secondary | ICD-10-CM | POA: Diagnosis not present

## 2014-10-17 DIAGNOSIS — Z96653 Presence of artificial knee joint, bilateral: Secondary | ICD-10-CM | POA: Insufficient documentation

## 2014-10-17 MED ORDER — TECHNETIUM TC 99M MEDRONATE IV KIT
25.0000 | PACK | Freq: Once | INTRAVENOUS | Status: AC | PRN
  Administered 2014-10-17: 25 via INTRAVENOUS

## 2014-12-05 ENCOUNTER — Other Ambulatory Visit: Payer: Self-pay | Admitting: Family Medicine

## 2014-12-05 DIAGNOSIS — R519 Headache, unspecified: Secondary | ICD-10-CM

## 2014-12-05 DIAGNOSIS — R51 Headache: Principal | ICD-10-CM

## 2014-12-12 ENCOUNTER — Ambulatory Visit
Admission: RE | Admit: 2014-12-12 | Discharge: 2014-12-12 | Disposition: A | Discharge status: Home / Self Care | Payer: Medicare Other | Source: Ambulatory Visit | Attending: Family Medicine | Admitting: Family Medicine

## 2014-12-12 ENCOUNTER — Encounter (INDEPENDENT_AMBULATORY_CARE_PROVIDER_SITE_OTHER): Payer: Self-pay

## 2014-12-12 DIAGNOSIS — R519 Headache, unspecified: Secondary | ICD-10-CM

## 2014-12-12 DIAGNOSIS — R51 Headache: Principal | ICD-10-CM

## 2015-01-01 ENCOUNTER — Other Ambulatory Visit: Payer: Self-pay | Admitting: Family Medicine

## 2015-01-01 DIAGNOSIS — R270 Ataxia, unspecified: Secondary | ICD-10-CM

## 2015-01-11 ENCOUNTER — Other Ambulatory Visit: Payer: Medicare Other

## 2015-01-20 ENCOUNTER — Ambulatory Visit
Admission: RE | Admit: 2015-01-20 | Discharge: 2015-01-20 | Disposition: A | Discharge status: Home / Self Care | Payer: Medicare Other | Source: Ambulatory Visit | Attending: Family Medicine | Admitting: Family Medicine

## 2015-01-20 DIAGNOSIS — R270 Ataxia, unspecified: Secondary | ICD-10-CM

## 2015-02-25 ENCOUNTER — Encounter: Payer: Self-pay | Admitting: Neurology

## 2015-02-25 ENCOUNTER — Ambulatory Visit (INDEPENDENT_AMBULATORY_CARE_PROVIDER_SITE_OTHER): Payer: Medicare Other | Admitting: Neurology

## 2015-02-25 VITALS — BP 137/87 | HR 89 | Ht 62.0 in | Wt 126.0 lb

## 2015-02-25 DIAGNOSIS — R51 Headache: Secondary | ICD-10-CM

## 2015-02-25 DIAGNOSIS — R519 Headache, unspecified: Secondary | ICD-10-CM

## 2015-02-25 DIAGNOSIS — Z8669 Personal history of other diseases of the nervous system and sense organs: Secondary | ICD-10-CM

## 2015-02-25 NOTE — Progress Notes (Signed)
PATIENT: Christy Preston DOB: 1941-01-03  Chief Complaint  Patient presents with  . Headache    She had an episode in June 2016 where she experienced double vision and a stumbling gait that lasted one day.  At the same time, she developed a headache, blurred vision and cough that lasted about three weeks.  Reports symptoms have all resolved with the exception of a cough that she is treating with Mucinex.     HISTORICAL  Christy Preston is a 74 years old right-handed female, seen in refer by  her primary care physician Dr. Beverley Fiedler for evaluation of headaches  In June 2016, she developed prolonged cough, was given antibiotics, steroid, a few days later, she was seen for her left thigh tick bite, she was given another rounds of doxycycline, during that period of time, she felt dizziness, bilateral frontal headaches, sometimes at occipital region, pressure, relieved by aspirin, also described one episode of double vision while driving  She was referred for CAT scan, an MRI of the brain in January 20 2015, we have reviewed MRI together, mild supratentorium small vessel disease, MRI of the brain showed no large vessel disease.  Her symptoms, headaches has much improved, especially with her recent treatment for UTI in early September, she no longer has double vision, only rarely get bilateral temporal headaches, she denies body achiness, denied chewing difficulties.  Laboratory evaluation in July 2016, showed normal CMP with exception of low sodium 133, normal CBC, She did have a history of migraine headaches in the past, usually preceded by visual aura, the headache she is having now is different from her previous migraine.   REVIEW OF SYSTEMS: Full 14 system review of systems performed and notable only for fatigue, hearing loss, ringing ears, blurred vision, double vision, cough, headaches, sleepiness, anxiety  ALLERGIES: No Known Allergies  HOME MEDICATIONS: Current Outpatient  Prescriptions  Medication Sig Dispense Refill  . aspirin 81 MG tablet Take 81 mg by mouth daily.    . Aspirin-Caffeine (ANACIN PO) Take by mouth. As needed for headache.    . Pseudoephedrine-Guaifenesin (MUCINEX D PO) Take by mouth. As needed for cough.     No current facility-administered medications for this visit.    PAST MEDICAL HISTORY: Past Medical History  Diagnosis Date  . Diverticulosis of colon   . Arthritis   . Hx of headache   . Hearing loss   . Anxiety     PAST SURGICAL HISTORY: Past Surgical History  Procedure Laterality Date  . Joint replacement  2004    Right knee  . Tonsillectomy    . Carpal tunnel release      right hand  . Colon surgery  2006    6 inches removed   . 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 Diabetes Father   . Stroke Mother   . Stroke Father     SOCIAL HISTORY:  Social History   Social History  . Marital Status: Married    Spouse Name: N/A  . Number of Children: 0  . Years of Education: 16   Occupational History  . Retired    Social History Main Topics  . Smoking status: Never Smoker   . Smokeless tobacco: Not on file  . Alcohol Use: 1.8  oz/week    3 Glasses of wine per week     Comment: occassional - 2 glasses per week  . Drug Use: No  . Sexual Activity: Not on file   Other Topics Concern  . Not on file   Social History Narrative   Lives at home with her husband.   Right-handed.   3-4 cups caffeine per day.     PHYSICAL EXAM   Filed Vitals:   02/25/15 1335  BP: 137/87  Pulse: 89  Height: $Remove'5\' 2"'NLaoCnB$  (1.575 m)  Weight: 126 lb (57.153 kg)    Not recorded      Body mass index is 23.04 kg/(m^2).  PHYSICAL EXAMNIATION:  Gen: NAD, conversant, well nourised, obese, well groomed                     Cardiovascular:  Regular rate rhythm, no peripheral edema, warm, nontender. Eyes: Conjunctivae clear without exudates or hemorrhage Neck: Supple, no carotid bruise. Pulmonary: Clear to auscultation bilaterally   NEUROLOGICAL EXAM:  MENTAL STATUS: Speech:    Speech is normal; fluent and spontaneous with normal comprehension.  Cognition:     Orientation to time, place and person     Normal recent and remote memory     Normal Attention span and concentration     Normal Language, naming, repeating,spontaneous speech     Fund of knowledge   CRANIAL NERVES: CN II: Visual fields are full to confrontation. Fundoscopic exam is normal with sharp discs and no vascular changes. Pupils are round equal and briskly reactive to light. CN III, IV, VI: extraocular movement are normal. No ptosis. CN V: Facial sensation is intact to pinprick in all 3 divisions bilaterally. Corneal responses are intact.  CN VII: Face is symmetric with normal eye closure and smile. CN VIII: Hearing is normal to rubbing fingers CN IX, X: Palate elevates symmetrically. Phonation is normal. CN XI: Head turning and shoulder shrug are intact CN XII: Tongue is midline with normal movements and no atrophy.Bilateral temporal artery pulse was present   MOTOR: There is no pronator drift of out-stretched arms. Muscle bulk and tone are normal. Muscle strength is normal.  REFLEXES: Reflexes are 2+ and symmetric at the biceps, triceps, knees, and ankles. Plantar responses are flexor.  SENSORY: Intact to light touch, pinprick, position sense, and vibration sense are intact in fingers and toes.  COORDINATION: Rapid alternating movements and fine finger movements are intact. There is no dysmetria on finger-to-nose and heel-knee-shin.    GAIT/STANCE: Posture is normal. Gait is steady with normal steps, base, arm swing, and turning. Heel and toe walking are normal. Tandem gait is normal.  Romberg is absent.   DIAGNOSTIC DATA (LABS, IMAGING,  TESTING) - I reviewed patient records, labs, notes, testing and imaging myself where available.   ASSESSMENT AND PLAN  Christy Preston is a 74 y.o. female   History of migraine with visual aura Bilateral tension headaches  Improved with her most recent treatment of bronchitis, tick bite, UTI   If she continue have headaches at next follow-up, consider ESR, C-reactive protein, Ach receptor antibody with her reported history of transient double  vision    Marcial Pacas, M.D. Ph.D.  [facility] [address], [place], [place] [address] Ph: [phone] Fax: [phone]  CC: Dr. Aretta Nip, MD

## 2015-04-28 ENCOUNTER — Ambulatory Visit: Payer: Medicare Other | Admitting: Neurology

## 2015-04-28 ENCOUNTER — Telehealth: Payer: Self-pay | Admitting: *Deleted

## 2015-04-28 NOTE — Telephone Encounter (Signed)
No showed follow up appointment. 

## 2015-04-29 ENCOUNTER — Encounter: Payer: Self-pay | Admitting: Neurology
